# Patient Record
Sex: Female | Born: 1960
Health system: Southern US, Community
[De-identification: ages and names within clinical notes are randomized; demographics above are authoritative.]

## PROBLEM LIST (undated history)

## (undated) DIAGNOSIS — E785 Hyperlipidemia, unspecified: Secondary | ICD-10-CM

## (undated) DIAGNOSIS — Z9289 Personal history of other medical treatment: Secondary | ICD-10-CM

## (undated) DIAGNOSIS — IMO0001 Reserved for inherently not codable concepts without codable children: Secondary | ICD-10-CM

## (undated) DIAGNOSIS — M7989 Other specified soft tissue disorders: Secondary | ICD-10-CM

## (undated) DIAGNOSIS — K219 Gastro-esophageal reflux disease without esophagitis: Secondary | ICD-10-CM

## (undated) DIAGNOSIS — R002 Palpitations: Secondary | ICD-10-CM

## (undated) DIAGNOSIS — M2550 Pain in unspecified joint: Secondary | ICD-10-CM

## (undated) DIAGNOSIS — D649 Anemia, unspecified: Secondary | ICD-10-CM

## (undated) DIAGNOSIS — R112 Nausea with vomiting, unspecified: Secondary | ICD-10-CM

## (undated) DIAGNOSIS — F329 Major depressive disorder, single episode, unspecified: Secondary | ICD-10-CM

## (undated) DIAGNOSIS — E739 Lactose intolerance, unspecified: Secondary | ICD-10-CM

## (undated) DIAGNOSIS — R7303 Prediabetes: Secondary | ICD-10-CM

## (undated) DIAGNOSIS — K589 Irritable bowel syndrome without diarrhea: Secondary | ICD-10-CM

## (undated) DIAGNOSIS — Z9889 Other specified postprocedural states: Secondary | ICD-10-CM

## (undated) DIAGNOSIS — I10 Essential (primary) hypertension: Secondary | ICD-10-CM

## (undated) DIAGNOSIS — F32A Depression, unspecified: Secondary | ICD-10-CM

## (undated) DIAGNOSIS — R252 Cramp and spasm: Secondary | ICD-10-CM

## (undated) DIAGNOSIS — J45909 Unspecified asthma, uncomplicated: Secondary | ICD-10-CM

## (undated) DIAGNOSIS — F419 Anxiety disorder, unspecified: Secondary | ICD-10-CM

## (undated) DIAGNOSIS — N979 Female infertility, unspecified: Secondary | ICD-10-CM

## (undated) DIAGNOSIS — R0602 Shortness of breath: Secondary | ICD-10-CM

## (undated) HISTORY — DX: Irritable bowel syndrome, unspecified: K58.9

## (undated) HISTORY — DX: Hyperlipidemia, unspecified: E78.5

## (undated) HISTORY — DX: Palpitations: R00.2

## (undated) HISTORY — PX: APPENDECTOMY: SHX54

## (undated) HISTORY — DX: Anemia, unspecified: D64.9

## (undated) HISTORY — PX: ABDOMINAL HYSTERECTOMY: SHX81

## (undated) HISTORY — PX: OTHER SURGICAL HISTORY: SHX169

## (undated) HISTORY — DX: Shortness of breath: R06.02

## (undated) HISTORY — DX: Pain in unspecified joint: M25.50

## (undated) HISTORY — DX: Lactose intolerance, unspecified: E73.9

## (undated) HISTORY — DX: Cramp and spasm: R25.2

## (undated) HISTORY — DX: Unspecified asthma, uncomplicated: J45.909

## (undated) HISTORY — DX: Prediabetes: R73.03

## (undated) HISTORY — DX: Other specified soft tissue disorders: M79.89

## (undated) HISTORY — DX: Female infertility, unspecified: N97.9

---

## 1998-01-14 ENCOUNTER — Other Ambulatory Visit: Admission: RE | Admit: 1998-01-14 | Discharge: 1998-01-14 | Payer: Self-pay | Admitting: Obstetrics and Gynecology

## 1999-02-01 ENCOUNTER — Other Ambulatory Visit: Admission: RE | Admit: 1999-02-01 | Discharge: 1999-02-01 | Payer: Self-pay | Admitting: Obstetrics and Gynecology

## 1999-12-01 ENCOUNTER — Encounter: Payer: Self-pay | Admitting: *Deleted

## 1999-12-01 ENCOUNTER — Ambulatory Visit (HOSPITAL_COMMUNITY): Admission: RE | Admit: 1999-12-01 | Discharge: 1999-12-01 | Payer: Self-pay | Admitting: *Deleted

## 2000-03-20 ENCOUNTER — Other Ambulatory Visit: Admission: RE | Admit: 2000-03-20 | Discharge: 2000-03-20 | Payer: Self-pay | Admitting: Obstetrics and Gynecology

## 2000-07-30 ENCOUNTER — Encounter (INDEPENDENT_AMBULATORY_CARE_PROVIDER_SITE_OTHER): Payer: Self-pay | Admitting: Specialist

## 2000-07-30 ENCOUNTER — Inpatient Hospital Stay (HOSPITAL_COMMUNITY): Admission: RE | Admit: 2000-07-30 | Discharge: 2000-08-02 | Payer: Self-pay | Admitting: Obstetrics and Gynecology

## 2001-04-08 ENCOUNTER — Other Ambulatory Visit: Admission: RE | Admit: 2001-04-08 | Discharge: 2001-04-08 | Payer: Self-pay | Admitting: Obstetrics and Gynecology

## 2002-05-30 ENCOUNTER — Other Ambulatory Visit: Admission: RE | Admit: 2002-05-30 | Discharge: 2002-05-30 | Payer: Self-pay | Admitting: Obstetrics and Gynecology

## 2003-08-28 ENCOUNTER — Other Ambulatory Visit: Admission: RE | Admit: 2003-08-28 | Discharge: 2003-08-28 | Payer: Self-pay | Admitting: Obstetrics and Gynecology

## 2005-11-07 ENCOUNTER — Emergency Department (HOSPITAL_COMMUNITY): Admission: EM | Admit: 2005-11-07 | Discharge: 2005-11-08 | Payer: Self-pay | Admitting: Emergency Medicine

## 2005-12-16 ENCOUNTER — Emergency Department (HOSPITAL_COMMUNITY): Admission: EM | Admit: 2005-12-16 | Discharge: 2005-12-16 | Payer: Self-pay | Admitting: Family Medicine

## 2007-02-20 ENCOUNTER — Other Ambulatory Visit (HOSPITAL_COMMUNITY): Admission: RE | Admit: 2007-02-20 | Discharge: 2007-05-21 | Payer: Self-pay | Admitting: Psychiatry

## 2007-02-20 ENCOUNTER — Ambulatory Visit: Payer: Self-pay | Admitting: Psychiatry

## 2007-12-17 ENCOUNTER — Other Ambulatory Visit: Admission: RE | Admit: 2007-12-17 | Discharge: 2007-12-17 | Payer: Self-pay | Admitting: Family Medicine

## 2008-04-20 ENCOUNTER — Other Ambulatory Visit: Admission: RE | Admit: 2008-04-20 | Discharge: 2008-04-20 | Payer: Self-pay | Admitting: Family Medicine

## 2009-01-21 ENCOUNTER — Emergency Department (HOSPITAL_COMMUNITY): Admission: EM | Admit: 2009-01-21 | Discharge: 2009-01-21 | Payer: Self-pay | Admitting: Emergency Medicine

## 2009-04-05 ENCOUNTER — Ambulatory Visit (HOSPITAL_COMMUNITY): Admission: RE | Admit: 2009-04-05 | Discharge: 2009-04-05 | Payer: Self-pay | Admitting: Obstetrics and Gynecology

## 2010-07-11 LAB — BASIC METABOLIC PANEL
BUN: 8 mg/dL (ref 6–23)
CO2: 32 mEq/L (ref 19–32)
Calcium: 9.2 mg/dL (ref 8.4–10.5)
Chloride: 100 mEq/L (ref 96–112)
Creatinine, Ser: 0.76 mg/dL (ref 0.4–1.2)
GFR calc Af Amer: >60 mL/min (ref >60)
GFR calc non Af Amer: >60 mL/min (ref >60)
Glucose, Bld: 93 mg/dL (ref 70–99)
Potassium: 4.2 mEq/L (ref 3.5–5.1)
Sodium: 137 mEq/L (ref 135–145)

## 2010-07-11 LAB — CBC
HCT: 34.8 % — ABNORMAL LOW (ref 36.0–46.0)
Hemoglobin: 12 g/dL (ref 12.0–15.0)
MCHC: 34.5 g/dL (ref 30.0–36.0)
MCV: 95.2 fL (ref 78.0–100.0)
Platelets: 263 10*3/uL (ref 150–400)
RBC: 3.66 MIL/uL — ABNORMAL LOW (ref 3.87–5.11)
RDW: 14.1 % (ref 11.5–15.5)
WBC: 7.9 10*3/uL (ref 4.0–10.5)

## 2010-08-26 NOTE — Discharge Summary (Signed)
[facility]  Patient:    Andrea Garza, Andrea Garza                      MRN: [id number] Adm. Date:  [id number] Disc. Date: [id number] Attending:  Frederich Balding                           Discharge Summary  PREOPERATIVE DIAGNOSES:       1. Pelvic pain and abnormal bleeding, possibly                                  secondary to uterine adenomyosis.                               2. Anatomical stress urinary incontinence.  DISCHARGE DIAGNOSES:          1. Pelvic pain and abnormal bleeding, possibly                                  secondary to uterine adenomyosis.                               2. Anatomical stress urinary incontinence.  OPERATIVE PROCEDURE:          1. Total abdominal hysterectomy.                               2. Culdoplasty.                               3. Retropubic suspension of the bladder.                               4. Cystoscopy with placement of a suprapubic                                  catheter.  HISTORY AND PHYSICAL:         For a complete history and physical, please see dictated note.  HOSPITAL COURSE:              The patient underwent the above-noted surgery. Postoperatively she did well. Her postoperative hemoglobin was 9.9. We began clamping of the suprapubic catheter on the day after surgery. She was able to void. By the second postoperative day, most residuals were under 100 cc.  On the morning of her third postoperative day, the suprapubic catheter was removed. On her third postoperative day she was afebrile with stable vital signs, abdomen soft and nontender. Bowel sounds were active. She was passing flatus. As noted above, she was voiding without difficulty and  the suprapubic catheter was removed. A low transverse incision was intact and stable, staples were removed and Steri-Strips were applied. She had no active vaginal bleeding and was discharged home at that time.  In terms of complications, none were  encountered during stay in the hospital. The patient was discharged home in stable condition.  DISPOSITION:  Routine postoperative instructions already given. She is to avoid heavy lifting, vaginal entry, or driving a car.  DISCHARGE MEDICATIONS:        Tylox as needed for pain and iron sulfate supplementation.  DISCHARGE INSTRUCTIONS:       She will call with increasing abdominal pain. Increasing nausea and vomiting should be reported. Any active vaginal bleeding or fever also should be reported. We also discussed watching for evidence of any type of deep venous thrombosis.  DISCHARGE FOLLOWUP:           The patient will be called tomorrow, August 03, 2000, and arranged followup in one week. DD:  08/02/00 TD:  08/02/00 Job: [id number]

## 2010-08-26 NOTE — Cardiovascular Report (Signed)
[facility]  Patient:    Andrea Garza, Andrea Garza                      MRN: [id number] Proc. Date: 12/01/99 Adm. Date:  [id number] Attending:  Daisey Must CC:         Rosalyn Gess. Norins, M.D. [facility]  Dietrich Pates, M.D. [facility]  The Cardiac Catheterization Laboratory   Cardiac Catheterization  PROCEDURE PERFORMED:  Left heart catheterization with coronary angiography and left ventriculography.  INDICATIONS:  [name] is a 50 year old woman seen in the office by Dr. Tenny Craw with symptoms of recurrent and progressive chest pain.  She was referred for cardiac catheterization to rule-out coronary ______ .  PROCEDURAL NOTE:  A #6 French sheath was placed in the right femoral artery. Standard Judkins, #6 French catheters were utilized.  There appeared to be catheter induced spasm in both the ostium and the left and right coronaries, which was treated with intracoronary nitroglycerin.  Contrast was Omnipaque. There were no complications.  CATHETERIZATION RESULTS: 1. Hemodynamics: Left ventricular pressure is 142/20, and aortic pressure is    146/78.  There was no aortic valve gradient. 2. Left ventriculogram: Wall motion is normal.  Ejection fraction is estimated    at 60%.  No mitral regurgitation. 3. Coronary arteriography (right dominant):    a. Left main is absent.  The LAD and the left circumflex arise from the       same location but have separate ostia.    b. The left anterior descending artery gives rise to two, small diagonal       branches.  The LAD is free of angiography disease.    c. The left circumflex gives rise to a small OM-1, small OM-2, and a large       branching OM-3.  The left circumflex is free of angiographic disease.    d. The right coronary artery has a 30% stenosis at its origin, which I       suspect is catheter induced spasm.  This did improve with intracoronary       nitroglycerin.  There is a normal sized posterior descending  artery and       a normal sized posterolateral branch.  IMPRESSIONS: 1. Normal left ventricular systolic function. 2. Angiographically normal coronary arteries. DD:  12/01/99 TD:  12/01/99 Job: [id number]

## 2010-08-26 NOTE — H&P (Signed)
[facility]  Patient:    Andrea Garza, Andrea Garza                      MRN: [id number] Adm. Date:  [id number] Attending:  Frederich Balding                         History and Physical  REASON FOR ADMISSION:         The patient is a 50 year old gravida 2 para 2 married white female who presents for a total abdominal hysterectomy along with retropubic suspension of the bladder and placement of a suprapubic catheter.  HISTORY OF PRESENT ILLNESS:   The patient has had a long-standing history of increasing menstrual flow.  Cycles she described as heavy.  She has 7-10 days of flow with several days being heavy.  During these times she changes pads and tampons every one to two hours.  With this, she does have clots and increasing discomfort.  She has been on iron sulfate supplementation for maintenance of her hemoglobin.  Mild anemia has been noted with values of 11. Evaluation has included ultrasounds which have suggested possible uterine adenomyosis.  She had a previous laparoscopy in the past with findings of endometriosis.  We have tried her on birth control pills without any significant success.  In addition to the above heavy bleeding, the patient has also been troubled with increasing urinary leakage.  She leaks a small amount of urine with such things as coughing and sneezing.  This at times can be limiting in terms of her activities.  She underwent bladder studies in November 2001.  She had a positive Q-tip test with greater than 60 degrees deflection with straining. Her postvoid residual was less than 100 cc.  Urine cystometrics showed there was no evidence of uninhibited contractions.  She had normal bladder volumes. After the catheter was removed she did reveal leaking with coughing.  This was consistent with anatomical stress urinary incontinence.  In view of this she has decided to proceed with a retropubic suspension of the bladder.  In terms of a  success rate for this, we do quote her an 80-90% success rate long-term. She does understand the need for a placement of a suprapubic catheter for bladder retraining.  There is a potential for long-term catheterization. During rare times there may need to be the need for surgical release to eventually result in the removal of the catheter.  ALLERGIES:                    No known drug allergies.  MEDICATIONS:                  Zoloft and Prevacid.  PAST MEDICAL HISTORY:         Usual childhood diseases, no significant sequelae.  Last year, because of some chest pain, she underwent a cardiac workup including cardiac catheterization which was completely negative.  SURGICAL HISTORY:             She has had previous appendectomy as well as a 1988 diagnostic laparoscopy with laser standby.  OBSTETRICAL HISTORY:          She has had two spontaneous vaginal deliveries.  FAMILY HISTORY:               Strong family history of breast cancer - her mother and maternal aunt both have breast cancer.  Her father had colon  cancer.  SOCIAL HISTORY:               No tobacco or alcohol use.  REVIEW OF SYSTEMS:            Noncontributory.  PHYSICAL EXAMINATION:  VITAL SIGNS:                  Patient is afebrile with stable vital signs.  HEENT:                        Patient is normocephalic.  Pupils equal, round, and reactive to light and accommodation.  Extraocular movements were intact. Sclerae and conjunctivae are clear.  Oropharynx clear.  NECK:                         Without thyromegaly.  BREASTS:                      No discrete masses.  LUNGS:                        Clear.  CARDIOVASCULAR:               Regular rhythm and rate without murmurs or gallops.  ABDOMEN:                      Benign.  PELVIC:                       Normal external genitalia, vaginal mucosa clear. Cervix unremarkable.  Uterus upper limits of normal size, moderately tender and boggy.  Adnexa  unremarkable.  EXTREMITIES:                  Trace edema.  NEUROLOGIC:                   Grossly within normal limits.  BASIC IMPRESSION:             1. Menorrhagia and dysmenorrhea, possibly                                  secondary to uterine adenomyosis.                               2. Anatomical stress urinary incontinence.  PLAN:                         The patient to undergo total abdominal hysterectomy with retropubic suspension of the bladder and placement of a suprapubic catheter.  The risks of surgery have been discussed, including the risk of anesthesia; the risk of infection; the risk of hemorrhage that could necessitate transfusion with the risk of AIDS or hepatitis; the risk of injury to adjacent organs including bladder, bowel, or ureters that could require further exploratory surgery; the risk of deep venous thrombosis and pulmonary emboli.  The patient professed an understanding of the indications and risks. DD:  07/30/00 TD:  07/30/00 Job: [id number]

## 2010-08-26 NOTE — Op Note (Signed)
[facility]  Patient:    Andrea Garza, Andrea Garza                      MRN: [id number] Proc. Date: 07/30/00 Adm. Date:  [id number] Attending:  Frederich Balding                           Operative Report  PREOPERATIVE DIAGNOSES:       1. Probable uterine adenomyosis with associated                                  menorrhagia and dysmenorrhea.                               2. Anatomical stress urinary incontinence.  POSTOPERATIVE DIAGNOSES:      1. Probable uterine adenomyosis with associated                                  menorrhagia and dysmenorrhea.                               2. Anatomical stress urinary incontinence.  OPERATION:                    Total abdominal hysterectomy.  Culdoplasty.                               Retropubic suspension of the bladder using the                               Burch procedure.  Cystoscopy through a cystotomy                               incision.  Placement of suprapubic catheter.  SURGEON:                      Juluis Mire, M.D.  ASSISTANTWilley Blade, M.D.  ANESTHESIA:                   General endotracheal anesthesia.  ESTIMATED BLOOD LOSS:         300 to 400 cc.  PACKS AND DRAINS:             None.  INTRAOPERATIVE BLOOD PLACED:  None.  COMPLICATIONS:                None.  INDICATIONS:                  As dictated in the history and physical.  DESCRIPTION OF PROCEDURE:     The patient was taken to the OR and placed in the supine position. After satisfactory level of general endotracheal anesthesia was obtained, the patient was placed in the dorsal lithotomy position using Allen stirrups. The abdomen, peritoneum, and vagina were prepped out with Betadine and draped as a sterile field.  A  low transverse skin incision was made with a knife and carried through subcutaneous tissue. The anterior rectus fascia entered sharply and the incision in the fascia extended laterally.  The  fascia was taken off the muscle superiorly and inferiorly using both blunt and sharp dissection. Rectus muscles were separated in the midline. The anterior peritoneum was entered sharply. The incision in the peritoneum extended both superiorly and inferiorly.  Palpation of the upper abdomen revealed a smooth liver, gallbladder not palpable, both kidneys were felt to be of normal size and size, and appendix was surgically absent.  Uterus was upper limits of normal size. The tubes and ovaries were unremarkable.  An OConnor-OSullivan retractor was put in place and bowel contents were packed superiorly.  Both round ligaments were clamped, cut, and suture ligated with 0 Vicryl. Both utero-ovarian pedicles were isolated, clamped, cut and doubly ligated first with a free tie of 0 Vicryl and then with a suture ligature of 0 Vicryl.  The bladder flap was then developed. Using the clamp, cut and tie technique with suture ligatures of 0 Vicryl, the parametrium was serially separated from the side of the uterus down to the vaginal angles.  The vaginal angles were then clamped and cut. These were suture ligated with 0 Vicryl.  Intervening vaginal mucosa was then excised and the uterus was passed off the operative field.  The vaginal cuff was then closed with running suture of 0 Vicryl.  Areas of oozing from the bladder base were brought under control with the bipolar.  The ovaries were suspended to the round ligament with 0 Vicryl.  We brought the uterosacral ligaments together in the midline with interrupted sutures of 0 Vicryl avoiding the ureters.  With this, we had complete obliteration of the cul-de-sac.  We thoroughly irrigated the pelvis and hemostasis was excellent.  There was no active bleeding.  Urine output remained clear and adequate.  At this point in time, the sponge and self-retaining retractor were removed.  The peritoneum was closed with running suture of 2-0 Vicryl.  Attention was now  turned to the retropubic suspension of the bladder. The space of Retzius was developed.  The gloved hand was then placed in the vaginal vault.  Two sutures of 0 Prolene were placed at the urethrovesical, angled in the underlying paravaginal tissue and secured to Coopers ligament. Two lateral sutures were also placed in the paravaginal tissue and also secured to Coopers ligament. These were all tied down with excellent elevation of the bladder.  No active bleeding was encountered.  The bladder was then distended using irrigation through a three-way Foley.  A cystotomy was made with an 11 blade and the cystoscope was then introduced. Visualization revealed no evidence of suture material in the bladder. The patient had been given indigo carmine IV and both ureteral orifices were noted to be spilling copious amounts of blue urine. The cystoscope was then removed.  A Bonnano catheter was placed through the skin and through the cystostomy site and then this was closed with 3-0 chromic.  Several layers were brought together to close the cystotomy site over the Bonnano catheter using the 3-0 chromic.  We had good hemostasis at this point in time.  We thoroughly irrigated the uterine incision.  The muscles were brought together with 3-0 Vicryl.  The fascia was closed with running suture of 0 Panacryl, sub q was closed with 3-0 Vicryl and the skin was closed with staples and Steri-Strips. Sponge, needle and instrument counts were reported  as correct by the circulating nurse x 2.  Urine output was blue-tinged but clear at this point in time.  The patient was extubated and alert and transferred to the recovery room in good condition. DD:  07/30/00 TD:  07/31/00 Job: [id number]

## 2010-12-10 DIAGNOSIS — Z9289 Personal history of other medical treatment: Secondary | ICD-10-CM

## 2010-12-10 HISTORY — DX: Personal history of other medical treatment: Z92.89

## 2010-12-10 HISTORY — PX: OTHER SURGICAL HISTORY: SHX169

## 2011-01-07 ENCOUNTER — Emergency Department (HOSPITAL_COMMUNITY): Payer: BC Managed Care – PPO

## 2011-01-07 ENCOUNTER — Inpatient Hospital Stay (HOSPITAL_COMMUNITY)
Admission: EM | Admit: 2011-01-07 | Discharge: 2011-01-13 | DRG: 218 | Disposition: A | Discharge status: Home / Self Care | Payer: BC Managed Care – PPO | Attending: Orthopedic Surgery | Admitting: Orthopedic Surgery

## 2011-01-07 DIAGNOSIS — F329 Major depressive disorder, single episode, unspecified: Secondary | ICD-10-CM | POA: Diagnosis present

## 2011-01-07 DIAGNOSIS — Y998 Other external cause status: Secondary | ICD-10-CM

## 2011-01-07 DIAGNOSIS — S42309A Unspecified fracture of shaft of humerus, unspecified arm, initial encounter for closed fracture: Secondary | ICD-10-CM | POA: Diagnosis present

## 2011-01-07 DIAGNOSIS — W010XXA Fall on same level from slipping, tripping and stumbling without subsequent striking against object, initial encounter: Secondary | ICD-10-CM | POA: Diagnosis present

## 2011-01-07 DIAGNOSIS — R7309 Other abnormal glucose: Secondary | ICD-10-CM | POA: Diagnosis present

## 2011-01-07 DIAGNOSIS — I10 Essential (primary) hypertension: Secondary | ICD-10-CM | POA: Diagnosis present

## 2011-01-07 DIAGNOSIS — Z6841 Body Mass Index (BMI) 40.0 and over, adult: Secondary | ICD-10-CM

## 2011-01-07 DIAGNOSIS — F3289 Other specified depressive episodes: Secondary | ICD-10-CM | POA: Diagnosis present

## 2011-01-07 DIAGNOSIS — Y9229 Other specified public building as the place of occurrence of the external cause: Secondary | ICD-10-CM

## 2011-01-07 DIAGNOSIS — S42253A Displaced fracture of greater tuberosity of unspecified humerus, initial encounter for closed fracture: Principal | ICD-10-CM | POA: Diagnosis present

## 2011-01-07 DIAGNOSIS — D62 Acute posthemorrhagic anemia: Secondary | ICD-10-CM | POA: Diagnosis not present

## 2011-01-07 DIAGNOSIS — E441 Mild protein-calorie malnutrition: Secondary | ICD-10-CM | POA: Diagnosis not present

## 2011-01-07 LAB — DIFFERENTIAL
Basophils Absolute: 0 10*3/uL (ref 0.0–0.1)
Basophils Relative: 0 % (ref 0–1)
Eosinophils Absolute: 0 10*3/uL (ref 0.0–0.7)
Eosinophils Relative: 0 % (ref 0–5)
Monocytes Absolute: 0.6 10*3/uL (ref 0.1–1.0)
Monocytes Relative: 4 % (ref 3–12)
Neutro Abs: 14.4 10*3/uL — ABNORMAL HIGH (ref 1.7–7.7)

## 2011-01-07 LAB — COMPREHENSIVE METABOLIC PANEL
ALT: 16 U/L (ref 0–35)
Albumin: 3.7 g/dL (ref 3.5–5.2)
Alkaline Phosphatase: 85 U/L (ref 39–117)
Calcium: 9.2 mg/dL (ref 8.4–10.5)
GFR calc Af Amer: >60 mL/min (ref >60)
Potassium: 3.9 mEq/L (ref 3.5–5.1)
Sodium: 138 mEq/L (ref 135–145)
Total Protein: 7.2 g/dL (ref 6.0–8.3)

## 2011-01-07 LAB — CBC
MCH: 31.9 pg (ref 26.0–34.0)
MCHC: 34.1 g/dL (ref 30.0–36.0)
Platelets: 232 10*3/uL (ref 150–400)
RDW: 13.4 % (ref 11.5–15.5)

## 2011-01-07 LAB — ABO/RH: ABO/RH(D): A POS

## 2011-01-08 LAB — HEMOGLOBIN AND HEMATOCRIT, BLOOD: Hemoglobin: 12.5 g/dL (ref 12.0–15.0)

## 2011-01-09 ENCOUNTER — Inpatient Hospital Stay (HOSPITAL_COMMUNITY): Payer: BC Managed Care – PPO

## 2011-01-09 LAB — MAGNESIUM: Magnesium: 1.9 mg/dL (ref 1.5–2.5)

## 2011-01-09 LAB — PHOSPHORUS: Phosphorus: 2.5 mg/dL (ref 2.3–4.6)

## 2011-01-09 LAB — CALCIUM: Calcium: 9 mg/dL (ref 8.4–10.5)

## 2011-01-10 ENCOUNTER — Inpatient Hospital Stay (HOSPITAL_COMMUNITY): Payer: BC Managed Care – PPO

## 2011-01-10 LAB — COMPREHENSIVE METABOLIC PANEL
AST: 12 U/L (ref 0–37)
BUN: 9 mg/dL (ref 6–23)
CO2: 30 mEq/L (ref 19–32)
Calcium: 8.9 mg/dL (ref 8.4–10.5)
Chloride: 101 mEq/L (ref 96–112)
Creatinine, Ser: 0.62 mg/dL (ref 0.50–1.10)
GFR calc Af Amer: >90 mL/min (ref >90)
GFR calc non Af Amer: >90 mL/min (ref >90)
Glucose, Bld: 101 mg/dL — ABNORMAL HIGH (ref 70–99)
Total Bilirubin: 0.4 mg/dL (ref 0.3–1.2)

## 2011-01-10 LAB — CBC
HCT: 30.1 % — ABNORMAL LOW (ref 36.0–46.0)
HCT: 29.1 % — ABNORMAL LOW (ref 36.0–46.0)
MCH: 31.7 pg (ref 26.0–34.0)
MCHC: 35.4 g/dL (ref 30.0–36.0)
MCV: 93.5 fL (ref 78.0–100.0)
Platelets: 150 10*3/uL (ref 150–400)
RBC: 3.22 MIL/uL — ABNORMAL LOW (ref 3.87–5.11)
RDW: 14.3 % (ref 11.5–15.5)
WBC: 7 10*3/uL (ref 4.0–10.5)
WBC: 7.7 10*3/uL (ref 4.0–10.5)

## 2011-01-10 LAB — PROTIME-INR
INR: 1.25 (ref 0.00–1.49)
Prothrombin Time: 16 seconds — ABNORMAL HIGH (ref 11.6–15.2)

## 2011-01-10 LAB — CALCIUM, IONIZED: Calcium, Ion: 1.2 mmol/L (ref 1.12–1.32)

## 2011-01-10 LAB — PTH, INTACT AND CALCIUM
Calcium, Total (PTH): 8.8 mg/dL (ref 8.4–10.5)
PTH: 69.4 pg/mL (ref 14.0–72.0)

## 2011-01-11 LAB — TYPE AND SCREEN
Unit division: 0
Unit division: 0

## 2011-01-11 LAB — POCT I-STAT 4, (NA,K, GLUC, HGB,HCT)
Glucose, Bld: 118 mg/dL — ABNORMAL HIGH (ref 70–99)
HCT: 21 % — ABNORMAL LOW (ref 36.0–46.0)
Hemoglobin: 7.1 g/dL — ABNORMAL LOW (ref 12.0–15.0)
Potassium: 3.8 mEq/L (ref 3.5–5.1)
Sodium: 138 mEq/L (ref 135–145)

## 2011-01-11 LAB — PROTIME-INR
INR: 1.12 (ref 0.00–1.49)
Prothrombin Time: 14.6 seconds (ref 11.6–15.2)

## 2011-01-11 LAB — BASIC METABOLIC PANEL
BUN: 7 mg/dL (ref 6–23)
Creatinine, Ser: 0.57 mg/dL (ref 0.50–1.10)
GFR calc Af Amer: >90 mL/min (ref >90)
GFR calc non Af Amer: >90 mL/min (ref >90)
Glucose, Bld: 103 mg/dL — ABNORMAL HIGH (ref 70–99)
Potassium: 3.5 mEq/L (ref 3.5–5.1)

## 2011-01-11 LAB — CBC
Hemoglobin: 9.5 g/dL — ABNORMAL LOW (ref 12.0–15.0)
MCH: 31.3 pg (ref 26.0–34.0)
MCHC: 34.2 g/dL (ref 30.0–36.0)
Platelets: 152 10*3/uL (ref 150–400)
RDW: 14.9 % (ref 11.5–15.5)

## 2011-01-11 LAB — VITAMIN D 1,25 DIHYDROXY: Vitamin D2 1, 25 (OH)2: <8 pg/mL

## 2011-01-12 LAB — BASIC METABOLIC PANEL
BUN: 6 mg/dL (ref 6–23)
Chloride: 102 mEq/L (ref 96–112)
Creatinine, Ser: 0.51 mg/dL (ref 0.50–1.10)
Glucose, Bld: 103 mg/dL — ABNORMAL HIGH (ref 70–99)
Potassium: 3.4 mEq/L — ABNORMAL LOW (ref 3.5–5.1)

## 2011-01-12 LAB — CBC
HCT: 26 % — ABNORMAL LOW (ref 36.0–46.0)
Hemoglobin: 8.8 g/dL — ABNORMAL LOW (ref 12.0–15.0)
MCV: 93.2 fL (ref 78.0–100.0)
WBC: 9.4 10*3/uL (ref 4.0–10.5)

## 2011-01-17 NOTE — Consult Note (Signed)
[name], SUITT NO.:  [id number]  MEDICAL RECORD NO.:  [id number]  LOCATION:  5012                         FACILITY:  [facility]  PHYSICIAN:  Doralee Albino. Carola Frost, M.D. DATE OF BIRTH:  12/25/1960  DATE OF CONSULTATION:  01/09/2011 DATE OF DISCHARGE:                                CONSULTATION   REQUESTING PHYSICIAN:  Dr. Vira Browns, Orthopedics  REASON FOR CONSULTATION:  Fall with proximal right humerus fracture.  BRIEF HISTORY OF PRESENT ILLNESS:  Miss Andrea Garza is a very pleasant 50- year-old right-hand-dominant female who was attending a wedding on Saturday afternoon.  She was been escorted down the [place].  She went to step into the row that her seat was in.  She did not appreciate the height difference from the floor to the surface that she was stepping on.  She subsequently lost her balance and struck a solid oak pew with her right shoulder.  Immediately, the patient knew there was something wrong as she was unable to move her right upper extremity.  She was brought to [facility] for evaluation.  X-rays here demonstrated a proximal right humerus fracture with shaft extension.  The patient was initially seen and evaluated by Dr. Vira Browns but given the severity and complexity of the injury, he sought consultation from Dr. Carola Frost in the Orthopedic Trauma Service.  The patient was admitted for pain control.  Currently, the patient is room 5 out of 12.  She states that her pain is 6-7/10, but is controlled with IV pain medication.  She denies any numbness or tingling in her right upper extremity.  Denies injuries elsewhere.  Denies any pain in her right elbow or wrist.  No chest pain or shortness of breath.  No nausea, vomiting or diarrhea. The patient denies any syncopal or episodes of blacking out prior to her fall.  She denies any recent illnesses.  No other complaints or concerns are expressed at this current time.  PAST MEDICAL HISTORY:  Notable  for hypertension and depression.  FAMILY HISTORY:  Noncontributory.  The patient denies any history of osteoporosis or metabolic bone disease.  SURGICAL HISTORY:  Notable for: 1. 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 2. TAH. 3. Appendectomy. 4. The patient did have a cardiac cath back in 2001, which     demonstrated normal coronary arteries and normal left ventricular     systolic function.  MEDICATIONS PRIOR TO ADMISSION:  Losartan and Effexor.  SOCIAL HISTORY:  The patient again is right-hand-dominant.  She is married, lives with her husband.  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  She does not smoke and is a social drinker. Does not use any additional drugs.  PHYSICAL EXAMINATION:  VITAL SIGNS:  Temperature 97.8, heart rate 84, respirations 16 and 97% on room air, BP is 116/72.  Most recent H and H is 12.5 and 37.7.  No significant abnormalities on admission labs were noted.  The patient's EKG on admission demonstrates sinus rhythm as well.  GENERAL:  The patient is awake, alert, in no acute distress, comfortable.  Appears appropriate for stated age, is obese. HEENT:  Head is atraumatic.  Extraocular muscles are intact.  Moist mucous membranes are noted. NECK:  Supple.  No lymphadenopathy.  No spinous process tenderness.  The patient demonstrates full range of motion. LUNGS:  Clear to auscultation bilaterally. CARDIAC:  S1-S2 are noted and regular. ABDOMEN:  Soft, nontender.  Positive bowel sounds.  Obese. EXTREMITIES:  Bilateral lower extremities are without any acute findings.  Motor and sensory functions are intact.  No deep calf tenderness is noted.  Left upper extremity is unremarkable as well. Radial, ulnar, median, axillary nerve, motor and sensory function are intact.  Extremities are warm.  Palpable radial pulse is appreciated. The patient demonstrates full range of motion of her shoulder, elbow, forearm, wrist and hand.  No  tenderness to palpation along her clavicle, proximal humerus, humeral shaft, elbow, forearm, wrist or hand. Examination of her right upper extremity the patient in long-arm posterior splint.  She is in a sling.  I did move the proximal aspect of the splint a little bit to assess her soft tissue, was not really able to fully access her shoulder, so I did not feel too substantial skin wrinkle with gentle compression.  The patient does have some tenderness to palpation.  Axillary nerve sensory function is intact.  Distally, fingers are warm.  Palpable radial pulse appreciated.  Swelling is very well controlled.  No pain with passive stretch.  Radial, ulnar, median nerve sensory functions are intact.  AIN and PIN motor function is intact as well.  No findings noted at the wrist or hand.  The patient does not report any tenderness at her elbow.  X-rays T view of her right shoulder demonstrates a comminuted right proximal humerus fracture.  It does appear to be involved in the portion of the greater tuberosity and I am concerned that there is head splitting portion.  There is extension of the propagation of the fracture in a spiral pattern down to the shaft.  Displacement is fairly mild with apex anterior type deformity.  Chest x-ray no acute findings are noted.  ASSESSMENT AND PLAN:  A 50 year old right-hand-dominant female status post mechanical fall with right proximal humerus and right humeral shaft fracture. 1. Right proximal humerus and right humeral shaft fracture OTA     classification 11 - C1 and 12-A1.  Firstly, we will obtain a CT     scan to fully evaluate the character of this fracture.  We plan to     take the patient to surgery tomorrow for formal ORIF.  Given her     age and her hand dominance, primary fixation of her shoulder is     warranted.  I do not feel that she is a candidate for     hemiarthroplasty at this time.  I did discuss all risks and     benefits of  proceeding with surgery with the patient.  She would     like to proceed a.s.a.p. but I am concerned that there is a head     splitting component to the fracture.  Again, a CT scan will help to     evaluate this.  The patient will be pendulums and passive motion     for the next 2-4 weeks with commencement of active assistive range     of motion of her shoulder at 4-6 week mark and active motion likely     occurring at the 8-week mark with resistance thereafter.  The     patient will have unrestricted active motion of her elbow, forearm,  wrist and hand after surgery.  She will be in a sling for comfort     as well.  I would expect a 2-3-day hospitalization after surgery.     The patient will be allowed to discharge home once her pain is     under control and if she is mobilizing well. 2. Hypertension.  Continue home medications. 3. Depression.  Continue home medications. 4. Pain.  We will continue PCA for now.  We will add intravenous     Tylenol to decrease narcotic use.  I have written the patient for     breakthrough OxyIR as well as Robaxin for additional control.  Deep     vein thrombosis pulmonary embolism prophylaxis and mobilization and     foot pumps.  The patient does not need any pharmacologic deep vein     thrombosis prophylaxis.  Diet:  Continue regular diet for now,     n.p.o. after midnight. 5. Metabolic bone disease.  Given fairly low energy injury with a fall     from a standing height, I will evaluate for any secondary causes of     osteoporosis.  We will check some basic labs and proceed from there     as labs indicate.  The patient does not have any significant risk     factors other than obesity for issues with nutrition and vitamin     and mineral metabolism.  No evidence of diabetes, chronic diseases     such as chronic obstructive pulmonary disease or chronic steroid     medication use which would indicate issues with bone healing and     bone metabolism.  No  evidence of hyperthyroidism as well.  But,     again we will check some basic labs to evaluate for any secondary     causes of metabolic bone disease.  DISPOSITION:  Proceed to the OR tomorrow for formal plate osteosynthesis of her right proximal humerus fracture and obtain CT scan today prior to surgery to facilitate surgical planning.     Mearl Latin, PA   ______________________________ Doralee Albino. Carola Frost, M.D.    KWP/MEDQ  D:  01/09/2011  T:  01/09/2011  Job:  [id number]  Electronically Signed by Montez Morita PA on 01/11/2011 12:07:43 PM Electronically Signed by Myrene Galas M.D. on 01/17/2011 06:53:52 PM

## 2011-01-17 NOTE — Discharge Summary (Signed)
Andrea Garza, Andrea Garza NO.:  [id number]  MEDICAL RECORD NO.:  [id number]  LOCATION:  5014                         FACILITY:  [facility]  PHYSICIAN:  Doralee Albino. Carola Frost, M.D. DATE OF BIRTH:  1960-09-10  DATE OF ADMISSION:  01/07/2011 DATE OF DISCHARGE:  01/13/2011                              DISCHARGE SUMMARY   DISCHARGE DIAGNOSES: 1. Mechanical fall. 2. Right proximal humerus fracture and right humeral shaft fracture. 3. Acute blood loss anemia, stable and asymptomatic. 4. Hypertension, stable on home meds. 5. Depression, stable on home meds. 6. Morbid obesity and mild malnutrition.  PROCEDURES PERFORMED:  On January 10, 2011, ORIF, right proximal humerus including greater tuberosity and ORIF, right humeral shaft.  BRIEF HISTORY OF PRESENT ILLNESS/BRIEF HOSPITAL COURSE:  Andrea Garza is very pleasant 50 year old right-hand dominant Caucasian female who was involved in a fall on January 07, 2011 while at church.  The patient misjudged the step and lost her balance and struck her right arm against the solid oak pew.  The patient was brought to [facility] for evaluation where she was found to have a complex right proximal humerus and right humeral shaft fracture.  She was seen and evaluated by Dr. Otelia Sergeant and admitted to the Orthopedic Service.  Given the complexity of the injury, Orthopedic Trauma Service and Dr. Carola Frost was consulted for definitive management.  The patient was seen by the Orthopedic Trauma Service on January 09, 2011 for preoperative evaluation.  The patient was then taken to the OR the following day on January 10, 2011 for procedure described up above.  The patient's hospital stay was relatively uncomplicated.  She progressed very well with physical therapy and occupational therapy on postoperative day #1.  She did not really ever demonstrate any acute abnormalities in her labs or vital signs.  Pain was well under control as well.  I did  initiate workup for possible metabolic bone disease, however, the patient did not exhibit any laboratory findings suggestive of such.  Particularly her PTH was within the normal range at 69.4, ionized calcium was 1.20.  Specific alkaline phosphatase was also within normal range of 11.6.  Vitamin D panel demonstrated a 25 hydroxy vitamin D level of 43.  Postoperative day #1, the patient was doing well, did complain of some right shoulder pain which was feeling like a toothache.  Did not have any numbness or tingling.  No chest pain, no shortness breath.  No nausea, vomiting, or diarrhea.  The patient was afebrile, vital signs stable.  Her labs were stable as well.  Physical exam was unremarkable.  Motor and sensory functions were intact with respect to the right upper extremity.  The patient began PT, OT on postoperative day #1 as well and she did very well from this standpoint.  Postoperative day #2, the patient continued to do well without any acute issues.  Feeling much better.  Pain was improved.  We did stop her PCA on postoperative day #2 and her pain was well-controlled with oral pain medications.  Again physical exam, her vital signs were unremarkable.  The patient was mobilizing by herself without any significant issues.  Postoperative day #3, the patient was  deemed stable for discharge to home.  Clinical encounter note of postoperative day #3 is as follows.  Subjective/objective, the patient is doing much better.  No pain medicines midnight and feeling very well. She is voiding well, has not had bowel movements in several days.  PHYSICAL EXAMINATION:  VITAL SIGNS:  Temperature 97.4, heart rate 88, respirations 18, and 99% on room air, BP is 118/58. GENERAL:  The patient sitting on edge of bed, eating breakfast. LUNGS:  Clear bilaterally. CARDIAC:  S1 and S2 are noted. ABDOMEN:  Soft, nontender with positive bowel sounds. EXTREMITIES:  Right upper extremity, incision looks  fantastic.  Radial, ulnar, median, and axillary nerve sensory function are intact.  Radial, ulnar, median, anterior interosseous and posterior interosseous nerve motor function intact.  Palpable radial pulse appreciated.  Extremities warm.  Swelling is well-controlled.  LABORATORY DATA:  Recent labs from January 12, 2011, sodium 136, potassium 3.4, chloride 102, bicarb 30, BUN 6, creatinine 0.51, glucose 130, calcium 8.1, hemoglobin 8.8, hematocrit 26.0, platelets 178,000, white blood cell 9.4, 25 hydroxy vitamin D is 43, intact PTH 69.4.  TSH normal at 1.309, prealbumin is slightly decreased at 15.3, phosphorus 2.5, magnesium 1.9.  ASSESSMENT/PLAN:  A 50 year old right-hand-dominant female status post mechanical fall with right proximal humerus fracture and humeral shaft fracture. 1. Right proximal humerus fracture and humeral shaft fracture status     post open reduction internal fixation postoperative day #3, doing     great.  Discharge home today. Nonweightbearing right upper     extremity.  Right shoulder pendulums and very gentle passive range     of motion of the right shoulder, abduction and forward flexion to  about 20 degrees, internal external rotation no greater than 10     degrees.  Active range of motion of the right elbow, forearm,     wrist, and hand are encouraged as well.  Ice as needed and dressing     changes as needed.  Sling for comfort. 2. Acute blood loss anemia, stable and asymptomatic. 3. Hypertension, stable home meds. 4. Depression, home meds. 5. Acute pain on oral pain medications are well controlled. 6. Deep venous thrombosis, pulmonary embolism prophylaxis.  The     patient was covered with Lovenox during her inpatient stay.  She     will not require any pharmacologics at discharge. 7. Morbid obesity and mild malnutrition based on low prealbumin.     Continue with well-balanced diet. 8. Disposition.  Discharge home day.  Follow up with Orthopedics in  10-     14 days.  DISCHARGE MEDICATIONS: 1. Vitamin C 500 mg 1 p.o. daily. 2. Colace 100 mg 1 p.o. b.i.d. as needed for her bowel movement. 3. Robaxin 500 mg 1-2 p.o. q.6 hours as needed for spasms. 4. OxyIR 1-3 p.o. q.3 hours as needed for pain. 5. Percocet 5/325, 1-2 p.o. q.6 hours as needed for pain. 6. The patient will resume her home medications including     losartan/hydrochlorothiazide 50/12.5, 1 p.o. daily, trazodone 100     mg 1 p.o. daily at bedtime, and venlafaxine 100 mg 1 p.o. b.i.d.  DISCHARGE INSTRUCTIONS AND PLANS:  Andrea Garza did sustain a fairly severe injury to her right proximal humerus which is her dominant side. In spite of this, we were able to achieve excellent fixation with plate osteosynthesis.  We are hopeful that the patient will go on and unite uneventfully and regain full motion and function of her right upper extremity.  The patient  will be nonweightbearing in her right upper extremity for the next 6-8 weeks.  She will be restricted to right shoulder pendulums and very gentle passive motion of her right shoulder for the next 4-6 weeks.  Gentle passive motion should be abduction and forward flexion to approximately 20 degrees and internal and external rotation no greater than 10 degrees.  Again in this particular time frame for the next 2 weeks, primary focus is pendulums.  The patient will also perform active range of motion of her right elbow, forearm, wrist, and hand.  I have discussed at length with the patient the importance of coming out of a sling periodically to work on elbow motion on her own as well as wrist, forearm, and hand range of motion.  I did not want the patient develop a contracture at her elbow.  Fortunately, Andrea Garza does not smoke nor does she demonstrate any evidence of metabolic bone disease.  Therefore, I would anticipate fairly uncomplicated healing course.  The patient will resume her regular diet prehospitalization.  She  does not require any pharmacologic DVT prophylaxis as she had been covered while she has been inpatient.  She will have home health OT primarily to ensure that she is working on shoulder motion as well as elbow, forearm, wrist, and hand motion.  The patient can use ice as needed for swelling and pain control.  She can use an Ace wrap or a upper extremity compression sleeve to help with swelling control.  I have encouraged the patient to continue motion of her digits to help with reduction of swelling as well.  The patient will be on Percocet, OxyIR, and Robaxin for pain control as well.  She will contact us if she has any issues with these pain medications.  I would like see the patient back in 10-14 days for reevaluation, followup x- rays, and possible removal of staples at that time.  Should the patient have any questions prior to her followup, she is encouraged to contact the office at [phone].     Andrea Latin, PA   ______________________________ Doralee Albino. Carola Frost, M.D.    KWP/MEDQ  D:  01/13/2011  T:  01/13/2011  Job:  [id number]  Electronically Signed by Montez Morita PA on 01/17/2011 04:30:04 PM Electronically Signed by Myrene Galas M.D. on 01/17/2011 06:54:10 PM

## 2011-01-17 NOTE — Op Note (Signed)
NAMEJACQULENE, Andrea Garza NO.:  [id number]  MEDICAL RECORD NO.:  [id number]  LOCATION:  3313                         FACILITY:  [facility]  PHYSICIAN:  Doralee Albino. Carola Frost, M.D. DATE OF BIRTH:  Aug 11, 1960  DATE OF PROCEDURE:  01/10/2011 DATE OF DISCHARGE:                              OPERATIVE REPORT   PREOPERATIVE DIAGNOSES: 1. Right proximal humerus fracture involving the greater tuberosity. 2. Right humeral shaft fracture.  POSTOPERATIVE DIAGNOSES: 1. Right proximal humerus fracture involving the greater tuberosity. 2. Right humeral shaft fracture.  PROCEDURE: 1. Open reduction and internal fixation, right proximal humerus. 2. Open reduction and internal fixation of humeral shaft.  SURGEON:  Doralee Albino. Carola Frost, MD  ASSISTANT:  Mearl Latin, PA  ANESTHESIA:  General.  COMPLICATIONS:  None.  TOURNIQUET:  None.  ESTIMATED BLOOD LOSS:  600 mL.  FINDINGS:  Continued intramedullary bleeding without a vascular injury.  DISPOSITION:  To PACU.  CONDITION:  Stable.  BRIEF SUMMARY AND INDICATION FOR PROCEDURE:  Andrea Garza is a 50 year old right-hand dominant female who fell at church during a wedding.  She underwent preoperative imaging consisting of plain films and CT scan demonstrating extensive fracture of both the proximal femur involving the greater tuberosity as well as a humeral shaft fracture distally.  We discussed with her the risks and benefits of surgery including possibility of infection, nerve injury, vessel injury, need for further surgery, malunion, nonunion, DVT, PE, heart attack, stroke, and multiple others.  She did wish to proceed.  BRIEF DESCRIPTION OF PROCEDURE:  Ms. Koury was taken to the operating room after administration of preop antibiotics.  She was positioned supine on a radiolucent table.  Standard prep and drape was performed of her right shoulder.  A standard deltopectoral approach was then made. Dissection was carried  down to the deltopectoral interval.  The cephalic vein was identified and dissected out and protected.  We then encountered the fracture site proximally and the patient began to bleed from this.  The bleeding persisted throughout the case and did not stop even after initial provisional reduction.  We did not encounter any significant bleeding from other sources and we did use thrombin spray in an attempt to improve some of the periosteal bleeding which we felt could contribute.  The fracture fragments were then exposed.  A curette was used to remove hematoma and the proximal humerus was exposed such that two #2 FiberWires could be passed into the greater tuberosity fragment and rotator cuff using Mason-Allen technique.  These did allow for retraction and control of that fragment.  Then attention was turned to the distal shaft fracture.  With the help of my assistant, Montez Morita, and the surgical scrub nurse, we were able to pull the fracture out to length and rotated, but could not be held provisionally with tenaculum or other limited fixation. Consequently, the small two 2-0 plates and 1.5 screws from the modular foot set were used to achieve a provisional reduction and to help assist with control the fragments and dialing in of the reduction through multiple adjustments.  Plates had to be fixed both to the medial side of the shaft fracture as well as anteriorly  along the comminuted lateral proximal fragment that did extend all the way up to the head.  After placement of 3 screws in each of those 2 plates, we then fastened a long DePuy Biomet plate that was positioned appropriately with K-wires into the head and on the shaft and was used to span both fractured areas. This was sequentially tightened down to bone and then fixation placed into the head.  The sutures controlling the greater tuberosity fragment were tied over the end of the proximal holes of the plate.  This resulted in what  appeared to be near anatomic reconstruction.  There were several additional 2.4 lag screws placed outside of the plate as well.  Montez Morita, PA-C, assisted me throughout the procedure and was absolutely necessary for its completion.  Additional plates and difficulty of the fracture extended the operative time but more or less double it.  The patient did receive 2 units of packed cells during the procedure for the persistent bleeding which did ultimately cease following application of the final plate and after administration of the 2 units.  Coags did not show significant elevation of the INR, PTT though the PT was slightly elevated at 16.  The patient was then taken to PACU in stable condition after standard layered closure including staples for the skin.  PROGNOSIS:  Ms. Encina will be in a sling for comfort with passive range of motion for the next 6 weeks and then active, assisted, and graduating to active motion.  She will mobilize as tolerated.  Her borderline diabetes increase the risk of nonunion and infection.     Doralee Albino. Carola Frost, M.D.     MHH/MEDQ  D:  01/10/2011  T:  01/10/2011  Job:  [id number]  Electronically Signed by Myrene Galas M.D. on 01/17/2011 06:53:57 PM

## 2013-01-29 ENCOUNTER — Encounter: Payer: Self-pay | Admitting: Cardiology

## 2013-02-06 DIAGNOSIS — R55 Syncope and collapse: Secondary | ICD-10-CM

## 2013-10-08 ENCOUNTER — Other Ambulatory Visit: Payer: Self-pay | Admitting: Family Medicine

## 2013-10-08 DIAGNOSIS — R2689 Other abnormalities of gait and mobility: Secondary | ICD-10-CM

## 2013-10-08 DIAGNOSIS — R42 Dizziness and giddiness: Secondary | ICD-10-CM

## 2013-10-25 ENCOUNTER — Other Ambulatory Visit: Payer: 59

## 2013-10-25 ENCOUNTER — Ambulatory Visit
Admission: RE | Admit: 2013-10-25 | Discharge: 2013-10-25 | Disposition: A | Discharge status: Home / Self Care | Payer: 59 | Source: Ambulatory Visit | Attending: Family Medicine | Admitting: Family Medicine

## 2013-10-25 DIAGNOSIS — R42 Dizziness and giddiness: Secondary | ICD-10-CM

## 2013-10-25 DIAGNOSIS — R2689 Other abnormalities of gait and mobility: Secondary | ICD-10-CM

## 2015-06-16 ENCOUNTER — Other Ambulatory Visit: Payer: Self-pay | Admitting: Gastroenterology

## 2015-06-17 ENCOUNTER — Encounter (HOSPITAL_COMMUNITY): Payer: Self-pay | Admitting: *Deleted

## 2015-06-21 ENCOUNTER — Ambulatory Visit (HOSPITAL_COMMUNITY)
Admission: RE | Admit: 2015-06-21 | Payer: Commercial Managed Care - HMO | Source: Ambulatory Visit | Admitting: Gastroenterology

## 2015-06-21 HISTORY — DX: Anxiety disorder, unspecified: F41.9

## 2015-06-21 HISTORY — DX: Other specified postprocedural states: Z98.890

## 2015-06-21 HISTORY — DX: Other specified postprocedural states: R11.2

## 2015-06-21 HISTORY — DX: Essential (primary) hypertension: I10

## 2015-06-21 HISTORY — DX: Major depressive disorder, single episode, unspecified: F32.9

## 2015-06-21 HISTORY — DX: Personal history of other medical treatment: Z92.89

## 2015-06-21 HISTORY — DX: Depression, unspecified: F32.A

## 2015-06-21 HISTORY — DX: Reserved for inherently not codable concepts without codable children: IMO0001

## 2015-06-21 HISTORY — DX: Gastro-esophageal reflux disease without esophagitis: K21.9

## 2015-06-21 SURGERY — COLONOSCOPY WITH PROPOFOL
Anesthesia: Monitor Anesthesia Care

## 2015-08-23 ENCOUNTER — Other Ambulatory Visit: Payer: Self-pay | Admitting: Gastroenterology

## 2015-08-24 ENCOUNTER — Other Ambulatory Visit: Payer: Self-pay | Admitting: Gastroenterology

## 2015-10-18 ENCOUNTER — Encounter (HOSPITAL_COMMUNITY): Payer: Self-pay | Admitting: *Deleted

## 2015-10-25 ENCOUNTER — Ambulatory Visit (HOSPITAL_COMMUNITY): Payer: Commercial Managed Care - HMO | Admitting: Anesthesiology

## 2015-10-25 ENCOUNTER — Ambulatory Visit (HOSPITAL_COMMUNITY)
Admission: RE | Admit: 2015-10-25 | Discharge: 2015-10-25 | Disposition: A | Discharge status: Home / Self Care | Payer: Commercial Managed Care - HMO | Source: Ambulatory Visit | Attending: Gastroenterology | Admitting: Gastroenterology

## 2015-10-25 ENCOUNTER — Encounter (HOSPITAL_COMMUNITY)
Admission: RE | Disposition: A | Discharge status: Home / Self Care | Payer: Self-pay | Source: Ambulatory Visit | Attending: Gastroenterology

## 2015-10-25 ENCOUNTER — Encounter (HOSPITAL_COMMUNITY): Payer: Self-pay

## 2015-10-25 DIAGNOSIS — I10 Essential (primary) hypertension: Secondary | ICD-10-CM | POA: Insufficient documentation

## 2015-10-25 DIAGNOSIS — Z800 Family history of malignant neoplasm of digestive organs: Secondary | ICD-10-CM | POA: Diagnosis not present

## 2015-10-25 DIAGNOSIS — K219 Gastro-esophageal reflux disease without esophagitis: Secondary | ICD-10-CM | POA: Diagnosis not present

## 2015-10-25 DIAGNOSIS — K921 Melena: Secondary | ICD-10-CM | POA: Diagnosis not present

## 2015-10-25 HISTORY — PX: COLONOSCOPY WITH PROPOFOL: SHX5780

## 2015-10-25 SURGERY — COLONOSCOPY WITH PROPOFOL
Anesthesia: Monitor Anesthesia Care

## 2015-10-25 MED ORDER — LACTATED RINGERS IV SOLN
INTRAVENOUS | Status: DC
  Administered 2015-10-25: 1000 mL via INTRAVENOUS

## 2015-10-25 MED ORDER — PROPOFOL 10 MG/ML IV BOLUS
INTRAVENOUS | Status: DC | PRN
  Administered 2015-10-25 (×2): 30 mg via INTRAVENOUS

## 2015-10-25 MED ORDER — PROPOFOL 500 MG/50ML IV EMUL
INTRAVENOUS | Status: DC | PRN
  Administered 2015-10-25: 150 ug/kg/min via INTRAVENOUS

## 2015-10-25 MED ORDER — PROPOFOL 10 MG/ML IV BOLUS
INTRAVENOUS | Status: AC
  Filled 2015-10-25: qty 60

## 2015-10-25 MED ORDER — SODIUM CHLORIDE 0.9 % IV SOLN: INTRAVENOUS | Status: DC

## 2015-10-25 MED ORDER — LIDOCAINE HCL (CARDIAC) 20 MG/ML IV SOLN
INTRAVENOUS | Status: DC | PRN
  Administered 2015-10-25: 25 mg via INTRATRACHEAL

## 2015-10-25 SURGICAL SUPPLY — 22 items

## 2015-10-25 NOTE — Op Note (Signed)
[facility] Patient Name: Andrea Garza Procedure Date: 10/25/2015 MRN: [id number] Attending MD: Charolett Bumpers , MD Date of Birth: Nov 18, 1960 CSN: [id number] Age: 55 Admit Type: Outpatient Procedure:                Colonoscopy Indications:              Screening for colorectal malignant neoplasm.                            Intermittent painless hematochezia. 01/23/2008                            normal screening colonoscopy was performed. Father                            was diagnosed with colon cancer at age 68 Providers:                Charolett Bumpers, MD, Jacquiline Doe, RN, [facility], Technician, Delphia Grates, CRNA Referring MD:              Medicines:                Propofol per Anesthesia Complications:            No immediate complications. Estimated Blood Loss:     Estimated blood loss: none. Procedure:                Pre-Anesthesia Assessment:                           - Prior to the procedure, a History and Physical                            was performed, and patient medications and                            allergies were reviewed. The patient's tolerance of                            previous anesthesia was also reviewed. The risks                            and benefits of the procedure and the sedation                            options and risks were discussed with the patient.                            All questions were answered, and informed consent                            was obtained. Prior Anticoagulants: The patient has  taken no previous anticoagulant or antiplatelet                            agents. ASA Grade Assessment: II - A patient with                            mild systemic disease. After reviewing the risks                            and benefits, the patient was deemed in                            satisfactory condition to undergo the procedure.              After obtaining informed consent, the colonoscope                            was passed under direct vision. Throughout the                            procedure, the patient's blood pressure, pulse, and                            oxygen saturations were monitored continuously. The                            EC-[id number] ([id number]) scope was introduced through                            the anus and advanced to the the cecum, identified                            by appendiceal orifice and ileocecal valve. The                            colonoscopy was performed without difficulty. The                            patient tolerated the procedure well. The quality                            of the bowel preparation was good. The appendiceal                            orifice and the rectum were photographed. Scope In: 8:44:15 AM Scope Out: 9:02:59 AM Scope Withdrawal Time: 0 hours 6 minutes 14 seconds  Total Procedure Duration: 0 hours 18 minutes 44 seconds  Findings:      The perianal and digital rectal examinations were normal.      The entire examined colon appeared normal. Impression:               - The entire examined colon is normal.                           -  No specimens collected. Moderate Sedation:      N/A- Per Anesthesia Care Recommendation:           - Patient has a contact number available for                            emergencies. The signs and symptoms of potential                            delayed complications were discussed with the                            patient. Return to normal activities tomorrow.                            Written discharge instructions were provided to the                            patient.                           - Repeat colonoscopy in 10 years for screening                            purposes.                           - Resume previous diet.                           - Continue present medications. Procedure Code(s):         --- Professional ---                           [phone], Colonoscopy, flexible; diagnostic, including                            collection of specimen(s) by brushing or washing,                            when performed (separate procedure) Diagnosis Code(s):        --- Professional ---                           Z12.11, Encounter for screening for malignant                            neoplasm of colon CPT copyright 2016 [employer]. All rights reserved. The codes documented in this report are preliminary and upon coder review may  be revised to meet current compliance requirements. Danise EdgeMartin Nava Song, MD Charolett BumpersMartin K Elliana Bal, MD 10/25/2015 9:10:04 AM This report has been signed electronically. Number of Addenda: 0

## 2015-10-25 NOTE — Anesthesia Postprocedure Evaluation (Signed)
Anesthesia Post Note  Patient: Andrea Garza  Procedure(s) Performed: Procedure(s) (LRB): COLONOSCOPY WITH PROPOFOL (N/A)  Patient location during evaluation: PACU Anesthesia Type: MAC Level of consciousness: awake and alert Pain management: pain level controlled Vital Signs Assessment: post-procedure vital signs reviewed and stable Respiratory status: spontaneous breathing, nonlabored ventilation, respiratory function stable and patient connected to nasal cannula oxygen Cardiovascular status: stable and blood pressure returned to baseline Anesthetic complications: no    Last Vitals:  Filed Vitals:   10/25/15 0930 10/25/15 0940  BP: 147/78 161/91  Pulse: 66 73  Temp:    Resp: 17 17    Last Pain: There were no vitals filed for this visit.               [name] DAVID

## 2015-10-25 NOTE — Discharge Instructions (Signed)
Colonoscopy °A colonoscopy is an exam to look at your colon. This exam can help find lumps (tumors), growths (polyps), bleeding, and redness and puffiness (inflammation) in your colon.  °BEFORE THE PROCEDURE °· Ask your doctor about changing or stopping your regular medicines. °· You may need to drink a large amount of a special liquid (oral bowel prep). You start drinking this the day before your procedure. It will cause you to have watery poop (stool). This cleans out your colon. °· Do not eat or drink anything else once you have started the bowel prep, unless your doctor tells you it is safe to do so. °· Make plans for someone to drive you home after the procedure. °PROCEDURE °· You will be given medicine to help you relax (sedative). °· You will lie on your side with your knees bent. °· A tube with a camera on the end is put in the opening of your butt (anus) and into your colon. Pictures are sent to a computer screen. Your doctor will look for anything that is not normal. °· Your doctor may take a tissue sample (biopsy) from your colon to be looked at more closely. °· The exam is finished when your doctor has viewed all of the colon. °AFTER THE PROCEDURE °· Do not drive for 24 hours after the exam. °· You may have a small amount of blood in your poop. This is normal. °· You may pass gas and have belly (abdominal) cramps. This is normal. °· Ask when your test results will be ready. Make sure you get your test results. °  °This information is not intended to replace advice given to you by your health care provider. Make sure you discuss any questions you have with your health care provider. °  °Document Released: 04/29/2010 Document Revised: 04/01/2013 Document Reviewed: 12/02/2012 °Elsevier Interactive Patient Education ©2016 [employer] ° °

## 2015-10-25 NOTE — Anesthesia Preprocedure Evaluation (Signed)
Anesthesia Evaluation  Patient identified by MRN, date of birth, ID band Patient awake    Reviewed: Allergy & Precautions, NPO status , Patient's Chart, lab work & pertinent test results  History of Anesthesia Complications (+) PONV  Airway Mallampati: I  TM Distance: >3 FB Neck ROM: Full    Dental   Pulmonary    Pulmonary exam normal        Cardiovascular hypertension, Pt. on medications Normal cardiovascular exam     Neuro/Psych Anxiety Depression    GI/Hepatic GERD  Controlled and Medicated,  Endo/Other    Renal/GU      Musculoskeletal   Abdominal   Peds  Hematology   Anesthesia Other Findings   Reproductive/Obstetrics                             Anesthesia Physical Anesthesia Plan  ASA: II  Anesthesia Plan: MAC   Post-op Pain Management:    Induction: Intravenous  Airway Management Planned: Natural Airway  Additional Equipment:   Intra-op Plan:   Post-operative Plan:   Informed Consent: I have reviewed the patients History and Physical, chart, labs and discussed the procedure including the risks, benefits and alternatives for the proposed anesthesia with the patient or authorized representative who has indicated his/her understanding and acceptance.     Plan Discussed with: CRNA and Surgeon  Anesthesia Plan Comments:         Anesthesia Quick Evaluation

## 2015-10-25 NOTE — Transfer of Care (Signed)
Immediate Anesthesia Transfer of Care Note  Patient: Andrea Garza  Procedure(s) Performed: Procedure(s): COLONOSCOPY WITH PROPOFOL (N/A)  Patient Location: PACU and Endoscopy Unit  Anesthesia Type:MAC  Level of Consciousness: awake and patient cooperative  Airway & Oxygen Therapy: Patient Spontanous Breathing and Patient connected to face mask oxygen  Post-op Assessment: Report given to RN and Post -op Vital signs reviewed and stable  Post vital signs: Reviewed and stable  Last Vitals:  Filed Vitals:   10/25/15 0719  BP: 162/85  Pulse: 72  Temp: 36.8 C  Resp: 18    Last Pain: There were no vitals filed for this visit.       Complications: No apparent anesthesia complications

## 2015-10-25 NOTE — H&P (Signed)
  Problem: Hematochezia. 01/23/2008 normal screening colonoscopy was performed. Father was diagnosed with colon cancer in his late 4660s.  History: The patient is a 55 year old female born 25-Apr-1960. She is scheduled to undergo diagnostic colonoscopy to evaluate hematochezia.  Past medical history: Hypertension. Anxiety with depression. Gastroesophageal reflux. Insomnia. Deviated septum repair. Appendectomy. Hysterectomy. Laparoscopy.  Medication allergies: Morphine and diclofenac  Exam: The patient is alert and lying comfortably on the endoscopy stretcher. Abdomen is soft and nontender to palpation. Lungs are clear to auscultation. Cardiac exam reveals a regular rhythm.  Plan: Proceed with diagnostic colonoscopy

## 2015-10-26 ENCOUNTER — Encounter (HOSPITAL_COMMUNITY): Payer: Self-pay | Admitting: Gastroenterology

## 2016-04-18 DIAGNOSIS — E782 Mixed hyperlipidemia: Secondary | ICD-10-CM | POA: Diagnosis not present

## 2016-04-18 DIAGNOSIS — I10 Essential (primary) hypertension: Secondary | ICD-10-CM | POA: Diagnosis not present

## 2016-04-18 DIAGNOSIS — J069 Acute upper respiratory infection, unspecified: Secondary | ICD-10-CM | POA: Diagnosis not present

## 2016-04-18 DIAGNOSIS — R7309 Other abnormal glucose: Secondary | ICD-10-CM | POA: Diagnosis not present

## 2016-04-18 DIAGNOSIS — R5383 Other fatigue: Secondary | ICD-10-CM | POA: Diagnosis not present

## 2016-08-29 DIAGNOSIS — H5203 Hypermetropia, bilateral: Secondary | ICD-10-CM | POA: Diagnosis not present

## 2016-09-21 DIAGNOSIS — R7303 Prediabetes: Secondary | ICD-10-CM | POA: Diagnosis not present

## 2016-09-21 DIAGNOSIS — K219 Gastro-esophageal reflux disease without esophagitis: Secondary | ICD-10-CM | POA: Diagnosis not present

## 2016-09-21 DIAGNOSIS — I10 Essential (primary) hypertension: Secondary | ICD-10-CM | POA: Diagnosis not present

## 2017-03-06 DIAGNOSIS — Z23 Encounter for immunization: Secondary | ICD-10-CM | POA: Diagnosis not present

## 2017-03-06 DIAGNOSIS — J018 Other acute sinusitis: Secondary | ICD-10-CM | POA: Diagnosis not present

## 2017-03-13 DIAGNOSIS — Z803 Family history of malignant neoplasm of breast: Secondary | ICD-10-CM | POA: Diagnosis not present

## 2017-03-13 DIAGNOSIS — Z808 Family history of malignant neoplasm of other organs or systems: Secondary | ICD-10-CM | POA: Diagnosis not present

## 2017-03-13 DIAGNOSIS — Z01419 Encounter for gynecological examination (general) (routine) without abnormal findings: Secondary | ICD-10-CM | POA: Diagnosis not present

## 2017-03-13 DIAGNOSIS — Z800 Family history of malignant neoplasm of digestive organs: Secondary | ICD-10-CM | POA: Diagnosis not present

## 2017-03-14 ENCOUNTER — Other Ambulatory Visit: Payer: Self-pay | Admitting: Obstetrics and Gynecology

## 2017-03-14 DIAGNOSIS — N6320 Unspecified lump in the left breast, unspecified quadrant: Secondary | ICD-10-CM

## 2017-03-16 ENCOUNTER — Ambulatory Visit
Admission: RE | Admit: 2017-03-16 | Discharge: 2017-03-16 | Disposition: A | Discharge status: Home / Self Care | Payer: 59 | Source: Ambulatory Visit | Attending: Obstetrics and Gynecology | Admitting: Obstetrics and Gynecology

## 2017-03-16 ENCOUNTER — Ambulatory Visit
Admission: RE | Admit: 2017-03-16 | Discharge: 2017-03-16 | Disposition: A | Discharge status: Home / Self Care | Payer: Commercial Managed Care - HMO | Source: Ambulatory Visit | Attending: Obstetrics and Gynecology | Admitting: Obstetrics and Gynecology

## 2017-03-16 DIAGNOSIS — N6489 Other specified disorders of breast: Secondary | ICD-10-CM | POA: Diagnosis not present

## 2017-03-16 DIAGNOSIS — N6320 Unspecified lump in the left breast, unspecified quadrant: Secondary | ICD-10-CM

## 2017-04-09 DIAGNOSIS — I10 Essential (primary) hypertension: Secondary | ICD-10-CM | POA: Diagnosis not present

## 2017-04-09 DIAGNOSIS — E782 Mixed hyperlipidemia: Secondary | ICD-10-CM | POA: Diagnosis not present

## 2017-04-09 DIAGNOSIS — R7303 Prediabetes: Secondary | ICD-10-CM | POA: Diagnosis not present

## 2017-05-15 DIAGNOSIS — Z13820 Encounter for screening for osteoporosis: Secondary | ICD-10-CM | POA: Diagnosis not present

## 2017-07-10 DIAGNOSIS — I872 Venous insufficiency (chronic) (peripheral): Secondary | ICD-10-CM | POA: Diagnosis not present

## 2017-07-10 DIAGNOSIS — J301 Allergic rhinitis due to pollen: Secondary | ICD-10-CM | POA: Diagnosis not present

## 2017-07-24 DIAGNOSIS — R87612 Low grade squamous intraepithelial lesion on cytologic smear of cervix (LGSIL): Secondary | ICD-10-CM | POA: Diagnosis not present

## 2017-08-27 DIAGNOSIS — R079 Chest pain, unspecified: Secondary | ICD-10-CM | POA: Diagnosis not present

## 2017-08-27 DIAGNOSIS — I10 Essential (primary) hypertension: Secondary | ICD-10-CM | POA: Diagnosis not present

## 2017-10-15 DIAGNOSIS — Z0000 Encounter for general adult medical examination without abnormal findings: Secondary | ICD-10-CM | POA: Diagnosis not present

## 2017-10-15 DIAGNOSIS — E782 Mixed hyperlipidemia: Secondary | ICD-10-CM | POA: Diagnosis not present

## 2017-10-15 DIAGNOSIS — Z0184 Encounter for antibody response examination: Secondary | ICD-10-CM | POA: Diagnosis not present

## 2017-10-15 DIAGNOSIS — Z1159 Encounter for screening for other viral diseases: Secondary | ICD-10-CM | POA: Diagnosis not present

## 2017-10-15 DIAGNOSIS — Z23 Encounter for immunization: Secondary | ICD-10-CM | POA: Diagnosis not present

## 2017-10-15 DIAGNOSIS — I10 Essential (primary) hypertension: Secondary | ICD-10-CM | POA: Diagnosis not present

## 2017-10-15 DIAGNOSIS — R7303 Prediabetes: Secondary | ICD-10-CM | POA: Diagnosis not present

## 2018-03-03 DIAGNOSIS — J069 Acute upper respiratory infection, unspecified: Secondary | ICD-10-CM | POA: Diagnosis not present

## 2018-03-18 DIAGNOSIS — Z01419 Encounter for gynecological examination (general) (routine) without abnormal findings: Secondary | ICD-10-CM | POA: Diagnosis not present

## 2018-03-18 DIAGNOSIS — Z6841 Body Mass Index (BMI) 40.0 and over, adult: Secondary | ICD-10-CM | POA: Diagnosis not present

## 2018-04-11 DIAGNOSIS — Z01419 Encounter for gynecological examination (general) (routine) without abnormal findings: Secondary | ICD-10-CM | POA: Diagnosis not present

## 2018-04-11 DIAGNOSIS — Z6841 Body Mass Index (BMI) 40.0 and over, adult: Secondary | ICD-10-CM | POA: Diagnosis not present

## 2018-04-30 DIAGNOSIS — J209 Acute bronchitis, unspecified: Secondary | ICD-10-CM | POA: Diagnosis not present

## 2018-05-14 DIAGNOSIS — E782 Mixed hyperlipidemia: Secondary | ICD-10-CM | POA: Diagnosis not present

## 2018-05-14 DIAGNOSIS — I10 Essential (primary) hypertension: Secondary | ICD-10-CM | POA: Diagnosis not present

## 2018-05-14 DIAGNOSIS — R7303 Prediabetes: Secondary | ICD-10-CM | POA: Diagnosis not present

## 2018-06-19 DIAGNOSIS — E8881 Metabolic syndrome: Secondary | ICD-10-CM | POA: Diagnosis not present

## 2018-06-19 DIAGNOSIS — R5383 Other fatigue: Secondary | ICD-10-CM | POA: Diagnosis not present

## 2018-06-19 DIAGNOSIS — R635 Abnormal weight gain: Secondary | ICD-10-CM | POA: Diagnosis not present

## 2018-06-19 DIAGNOSIS — R0602 Shortness of breath: Secondary | ICD-10-CM | POA: Diagnosis not present

## 2018-06-19 DIAGNOSIS — E7800 Pure hypercholesterolemia, unspecified: Secondary | ICD-10-CM | POA: Diagnosis not present

## 2018-06-19 DIAGNOSIS — E559 Vitamin D deficiency, unspecified: Secondary | ICD-10-CM | POA: Diagnosis not present

## 2018-06-19 DIAGNOSIS — Z79899 Other long term (current) drug therapy: Secondary | ICD-10-CM | POA: Diagnosis not present

## 2019-08-12 ENCOUNTER — Ambulatory Visit: Payer: 59

## 2019-08-19 ENCOUNTER — Ambulatory Visit: Payer: 59

## 2019-08-26 ENCOUNTER — Ambulatory Visit: Payer: 59

## 2019-09-10 ENCOUNTER — Ambulatory Visit (INDEPENDENT_AMBULATORY_CARE_PROVIDER_SITE_OTHER): Payer: 59 | Admitting: Bariatrics

## 2019-09-10 ENCOUNTER — Encounter (INDEPENDENT_AMBULATORY_CARE_PROVIDER_SITE_OTHER): Payer: Self-pay | Admitting: Bariatrics

## 2019-09-10 ENCOUNTER — Other Ambulatory Visit: Payer: Self-pay

## 2019-09-10 VITALS — BP 160/88 | HR 78 | Temp 98.3°F | Ht 64.0 in | Wt 312.0 lb

## 2019-09-10 DIAGNOSIS — K219 Gastro-esophageal reflux disease without esophagitis: Secondary | ICD-10-CM | POA: Diagnosis not present

## 2019-09-10 DIAGNOSIS — Z8616 Personal history of COVID-19: Secondary | ICD-10-CM

## 2019-09-10 DIAGNOSIS — E119 Type 2 diabetes mellitus without complications: Secondary | ICD-10-CM

## 2019-09-10 DIAGNOSIS — R5383 Other fatigue: Secondary | ICD-10-CM | POA: Diagnosis not present

## 2019-09-10 DIAGNOSIS — Z1331 Encounter for screening for depression: Secondary | ICD-10-CM | POA: Diagnosis not present

## 2019-09-10 DIAGNOSIS — R0602 Shortness of breath: Secondary | ICD-10-CM | POA: Diagnosis not present

## 2019-09-10 DIAGNOSIS — Z6841 Body Mass Index (BMI) 40.0 and over, adult: Secondary | ICD-10-CM

## 2019-09-10 DIAGNOSIS — Z0289 Encounter for other administrative examinations: Secondary | ICD-10-CM

## 2019-09-10 DIAGNOSIS — E669 Obesity, unspecified: Secondary | ICD-10-CM | POA: Insufficient documentation

## 2019-09-10 DIAGNOSIS — I10 Essential (primary) hypertension: Secondary | ICD-10-CM | POA: Diagnosis not present

## 2019-09-10 DIAGNOSIS — E1169 Type 2 diabetes mellitus with other specified complication: Secondary | ICD-10-CM

## 2019-09-10 DIAGNOSIS — Z9189 Other specified personal risk factors, not elsewhere classified: Secondary | ICD-10-CM | POA: Diagnosis not present

## 2019-09-10 NOTE — Progress Notes (Signed)
Chief Complaint:   OBESITY Andrea Garza (MR# [id number]) is a 59 y.o. female who presents for evaluation and treatment of obesity and related comorbidities. Current BMI is Body mass index is 53.55 kg/m.Marland Kitchen Andrea Garza has been struggling with her weight for many years and has been unsuccessful in either losing weight, maintaining weight loss, or reaching her healthy weight goal.  Andrea Garza is currently in the action stage of change and ready to dedicate time achieving and maintaining a healthier weight. Andrea Garza is interested in becoming our patient and working on intensive lifestyle modifications including (but not limited to) diet and exercise for weight loss.  Andrea Garza is lactose intolerant. She goes out to eat 5-6 times a week.  Andrea Garza's habits were reviewed today and are as follows: Her family eats meals together, she thinks her family will eat healthier with her, her desired weight loss is 112 lbs, she has been heavy most of her life, she started gaining weight at age 59, her heaviest weight ever was 312 pounds, she craves salty and carbs, she eats fruit before bed, she frequently makes poor food choices, she frequently eats larger portions than normal, she has binge eating behaviors and she struggles with emotional eating.  Depression Screen Andrea Garza's Food and Mood (modified PHQ-9) score was 18.  Depression screen PHQ 2/9 09/10/2019  Decreased Interest 2  Down, Depressed, Hopeless 2  PHQ - 2 Score 4  Altered sleeping 3  Tired, decreased energy 3  Change in appetite 3  Feeling bad or failure about yourself  2  Trouble concentrating 1  Moving slowly or fidgety/restless 1  Suicidal thoughts 1  PHQ-9 Score 18  Difficult doing work/chores Somewhat difficult   Subjective:   Other fatigue. Andrea Garza denies daytime somnolence and admits to waking up still tired. Andrea Garza generally gets 8 hours of sleep per night, and states that she does not sleep well most nights. Snoring is present. Apneic episodes are  not present. Epworth Sleepiness Score is 5.  SOB (shortness of breath) on exertion. Andrea Garza notes increasing shortness of breath with certain activities and seems to be worsening over time with weight gain. She notes getting out of breath sooner with activity than she used to. This has gotten worse recently. Andrea Garza denies shortness of breath at rest or orthopnea.  Essential hypertension. Andrea Garza is taking amlodipine, HCTZ, and losartan. Blood pressure is elevated today at 160/88 (increased anxiety this a.m.). Blood pressure is normally well controlled.  BP Readings from Last 3 Encounters:  09/10/19 (!) 160/88  10/25/15 (!) 161/91   Lab Results  Component Value Date   CREATININE 0.51 01/12/2011   CREATININE 0.57 01/11/2011   CREATININE 0.62 01/10/2011   Gastroesophageal reflux disease, unspecified whether esophagitis present. Andrea Garza is taking omeprazole.  Type 2 diabetes mellitus without complication, without long-term current use of insulin (HCC). Andrea Garza is on no medication. A1c 6.7 on 06/23/2019.  No results found for: HGBA1C Lab Results  Component Value Date   CREATININE 0.51 01/12/2011   No results found for: INSULIN  History of COVID-19. Andrea Garza reports mild signs and symptoms 06/01/2019 and lost taste and smell. She was not hospitalized. No monoclonal antibodies.  Depression screen. Andrea Garza had a strongly positive depression screen with a PHQ-9 score of 18.  At risk for heart disease. Andrea Garza is at a higher than average risk for cardiovascular disease due to hypertension and obesity.   Assessment/Plan:   Other fatigue. Andrea Garza does feel that her weight is causing her energy to be  lower than it should be. Fatigue may be related to obesity, depression or many other causes. Labs will be ordered, and in the meanwhile, Andrea Garza will focus on self care including making healthy food choices, increasing physical activity and focusing on stress reduction. EKG 12-Lead, VITAMIN D 25 Hydroxy (Vit-D  Deficiency, Fractures), Vitamin B12 testing ordered today.  SOB (shortness of breath) on exertion. Andrea Garza does feel that she gets out of breath more easily that she used to when she exercises. Andrea Garza's shortness of breath appears to be obesity related and exercise induced. She has agreed to work on weight loss and gradually increase exercise to treat her exercise induced shortness of breath. Will continue to monitor closely.  Essential hypertension. Andrea Garza is working on healthy weight loss and exercise to improve blood pressure control. We will watch for signs of hypotension as she continues her lifestyle modifications. She will continue her medications as directed.   Gastroesophageal reflux disease, unspecified whether esophagitis present. Intensive lifestyle modifications are the first line treatment for this issue. We discussed several lifestyle modifications today and she will continue to work on diet, exercise and weight loss efforts. Orders and follow up as documented in patient record. She will continue her medication as directed.   Counseling . If a person has gastroesophageal reflux disease (GERD), food and stomach acid move back up into the esophagus and cause symptoms or problems such as damage to the esophagus. . Anti-reflux measures include: raising the head of the bed, avoiding tight clothing or belts, avoiding eating late at night, not lying down shortly after mealtime, and achieving weight loss. . Avoid ASA, NSAID's, caffeine, alcohol, and tobacco.  . OTC Pepcid and/or Tums are often very helpful for as needed use.  Marland Kitchen However, for persisting chronic or daily symptoms, stronger medications like Omeprazole may be needed. . You may need to avoid foods and drinks such as: ? Coffee and tea (with or without caffeine). ? Drinks that contain alcohol. ? Energy drinks and sports drinks. ? Bubbly (carbonated) drinks or sodas. ? Chocolate and cocoa. ? Peppermint and mint flavorings. ? Garlic  and onions. ? Horseradish. ? Spicy and acidic foods. These include peppers, chili powder, curry powder, vinegar, hot sauces, and BBQ sauce. ? Citrus fruit juices and citrus fruits, such as oranges, lemons, and limes. ? Tomato-based foods. These include red sauce, chili, salsa, and pizza with red sauce. ? Fried and fatty foods. These include donuts, french fries, potato chips, and high-fat dressings. ? High-fat meats. These include hot dogs, rib eye steak, sausage, ham, and bacon.  Type 2 diabetes mellitus without complication, without long-term current use of insulin ([place]). Good blood sugar control is important to decrease the likelihood of diabetic complications such as nephropathy, neuropathy, limb loss, blindness, coronary artery disease, and death. Intensive lifestyle modification including diet, exercise and weight loss are the first line of treatment for diabetes. We discussed diabetes mellitus type II.  History of COVID-19. Andrea Garza(IGG)IA will be drawn today.  Depression screen. Andrea Garza had a positive depression screening. Depression is commonly associated with obesity and often results in emotional eating behaviors. We will monitor this closely and work on CBT to help improve the non-hunger eating patterns. Referral to Psychology may be required if no improvement is seen as she continues in our clinic.  At risk for heart disease. Andrea Garza was given approximately 15 minutes of coronary artery disease prevention counseling today. She is 59 y.o. female and has risk factors for heart disease  including obesity. We discussed intensive lifestyle modifications today with an emphasis on specific weight loss instructions and strategies.   Repetitive spaced learning was employed today to elicit superior memory formation and behavioral change.  Class 3 severe obesity with serious comorbidity and body mass index (BMI) of 50.0 to 59.9 in adult, unspecified obesity type (HCC).  Andrea Garza  is currently in the action stage of change and her goal is to continue with weight loss efforts. I recommend Andrea Garza begin the structured treatment plan as follows:  She has agreed to the Category 4 Plan minus 100 calories.  She will stop all sugary drinks, work on meal planning and intentional eating.  We reviewed with the patient labs including CMP, A1c, TSH, CBC, and lipids.  Exercise goals: All adults should avoid inactivity. Some physical activity is better than none, and adults who participate in any amount of physical activity gain some health benefits.   Behavioral modification strategies: increasing lean protein intake, decreasing simple carbohydrates, increasing vegetables, increasing water intake, decreasing eating out, no skipping meals, meal planning and cooking strategies, keeping healthy foods in the home and planning for success.  She was informed of the importance of frequent follow-up visits to maximize her success with intensive lifestyle modifications for her multiple health conditions. She was informed we would discuss her lab results at her next visit unless there is a critical issue that needs to be addressed sooner. Andrea Garza agreed to keep her next visit at the agreed upon time to discuss these results.  Objective:   Blood pressure (!) 160/88, pulse 78, temperature 98.3 F (36.8 C), temperature source Oral, height 5\' 4"  (1.626 m), weight (!) 312 lb (141.5 kg), SpO2 97 %. Body mass index is 53.55 kg/m.  EKG: Sinus  Rhythm with a rate of 73 BPM. Low voltage in precordial leads. Nonspecific T-abnormality.  Otherwise normal.  Indirect Calorimeter completed today shows a VO2 of 328 and a REE of 2281.  Her calculated basal metabolic rate is 2282 thus her basal metabolic rate is better than expected.  General: Cooperative, alert, well developed, in no acute distress. HEENT: Conjunctivae and lids unremarkable. Cardiovascular: Regular rhythm.  Lungs: Normal work of  breathing. Neurologic: No focal deficits.   Lab Results  Component Value Date   CREATININE 0.51 01/12/2011   BUN 6 01/12/2011   NA 136 01/12/2011   K 3.4 (L) 01/12/2011   CL 102 01/12/2011   CO2 30 01/12/2011   Lab Results  Component Value Date   ALT 11 01/10/2011   AST 12 01/10/2011   ALKPHOS 71 01/10/2011   BILITOT 0.4 01/10/2011   No results found for: HGBA1C No results found for: INSULIN Lab Results  Component Value Date   TSH 1.309 01/09/2011   No results found for: CHOL, HDL, LDLCALC, LDLDIRECT, TRIG, CHOLHDL Lab Results  Component Value Date   WBC 9.4 01/12/2011   HGB 8.8 (L) 01/12/2011   HCT 26.0 (L) 01/12/2011   MCV 93.2 01/12/2011   PLT 178 01/12/2011   No results found for: IRON, TIBC, FERRITIN  Attestation Statements:   Reviewed by clinician on day of visit: allergies, medications, problem list, medical history, surgical history, family history, social history, and previous encounter notes.  03/14/2011, am acting as Fernanda Drum for Energy manager, DO   I have reviewed the above documentation for accuracy and completeness, and I agree with the above. [employer], DO

## 2019-09-11 ENCOUNTER — Encounter (INDEPENDENT_AMBULATORY_CARE_PROVIDER_SITE_OTHER): Payer: Self-pay | Admitting: Bariatrics

## 2019-09-11 DIAGNOSIS — E559 Vitamin D deficiency, unspecified: Secondary | ICD-10-CM | POA: Insufficient documentation

## 2019-09-11 LAB — VITAMIN B12: Vitamin B-12: 284 pg/mL (ref 232–1245)

## 2019-09-11 LAB — VITAMIN D 25 HYDROXY (VIT D DEFICIENCY, FRACTURES): Vit D, 25-Hydroxy: 22.8 ng/mL — ABNORMAL LOW (ref 30.0–100.0)

## 2019-09-11 LAB — SAR COV2 SEROLOGY (COVID19)AB(IGG),IA: DiaSorin SARS-CoV-2 Ab, IgG: POSITIVE

## 2019-09-11 NOTE — Progress Notes (Signed)
Left msg for pt  to c/b re: labs

## 2019-09-11 NOTE — Progress Notes (Signed)
Pt called back and was advised of lab results

## 2019-09-24 ENCOUNTER — Ambulatory Visit (INDEPENDENT_AMBULATORY_CARE_PROVIDER_SITE_OTHER): Payer: 59 | Admitting: Bariatrics

## 2019-09-24 ENCOUNTER — Other Ambulatory Visit: Payer: Self-pay

## 2019-09-24 ENCOUNTER — Encounter (INDEPENDENT_AMBULATORY_CARE_PROVIDER_SITE_OTHER): Payer: Self-pay | Admitting: Bariatrics

## 2019-09-24 VITALS — BP 114/76 | HR 76 | Temp 97.9°F | Ht 64.0 in | Wt 310.0 lb

## 2019-09-24 DIAGNOSIS — Z9189 Other specified personal risk factors, not elsewhere classified: Secondary | ICD-10-CM

## 2019-09-24 DIAGNOSIS — Z6841 Body Mass Index (BMI) 40.0 and over, adult: Secondary | ICD-10-CM

## 2019-09-24 DIAGNOSIS — E559 Vitamin D deficiency, unspecified: Secondary | ICD-10-CM

## 2019-09-24 DIAGNOSIS — I10 Essential (primary) hypertension: Secondary | ICD-10-CM | POA: Diagnosis not present

## 2019-09-24 MED ORDER — VITAMIN D (ERGOCALCIFEROL) 1.25 MG (50000 UNIT) PO CAPS: 50000.0000 [IU] | ORAL_CAPSULE | ORAL | 0 refills | Status: DC

## 2019-09-25 ENCOUNTER — Encounter (INDEPENDENT_AMBULATORY_CARE_PROVIDER_SITE_OTHER): Payer: Self-pay | Admitting: Bariatrics

## 2019-09-25 NOTE — Progress Notes (Signed)
Chief Complaint:   OBESITY DALY WHIPKEY is here to discuss her progress with her obesity treatment plan along with follow-up of her obesity related diagnoses. Laneshia is on the Category 4 Plan minus 100 calories and states she is following her eating plan approximately 95% of the time. Teila states she is exercising 0 minutes 0 times per week.  Today's visit was #: 2 Starting weight: 312 lbs Starting date: 09/10/2019 Today's weight: 310 lbs Today's date: 09/24/2019 Total lbs lost to date: 2 Total lbs lost since last in-office visit: 2  Interim History: Makaylyn is down 2 lbs. She states that she does not struggle with the plan.  Subjective:   Vitamin D deficiency. Leilany is not on Vitamin D supplementation. Last Vitamin D was 22.8 on 09/10/2019.  Essential hypertension. Akyra is taking Norvasc, HCTZ, and Cozaar. Blood pressure is controlled.  BP Readings from Last 3 Encounters:  09/24/19 114/76  09/10/19 (!) 160/88  10/25/15 (!) 161/91   Lab Results  Component Value Date   CREATININE 0.51 01/12/2011   CREATININE 0.57 01/11/2011   CREATININE 0.62 01/10/2011   At risk for osteoporosis. Maddie is at higher risk of osteopenia and osteoporosis due to Vitamin D deficiency.   Assessment/Plan:   Vitamin D deficiency. Low Vitamin D level contributes to fatigue and are associated with obesity, breast, and colon cancer. She was given a prescription for Vitamin D, Ergocalciferol, (DRISDOL) 1.25 MG (50000 UNIT) CAPS capsule every week #4 with 0 refills and will follow-up for routine testing of Vitamin D, at least 2-3 times per year to avoid over-replacement.   Essential hypertension. Dyan is working on healthy weight loss and exercise to improve blood pressure control. We will watch for signs of hypotension as she continues her lifestyle modifications. She will continue her medications as directed.   At risk for osteoporosis. Emberleigh was given approximately 15 minutes of osteoporosis  prevention counseling today. Sharayah is at risk for osteopenia and osteoporosis due to her Vitamin D deficiency. She was encouraged to take her Vitamin D and follow her higher calcium diet and increase strengthening exercise to help strengthen her bones and decrease her risk of osteopenia and osteoporosis.  Repetitive spaced learning was employed today to elicit superior memory formation and behavioral change.  Class 3 severe obesity with serious comorbidity and body mass index (BMI) of 50.0 to 59.9 in adult, unspecified obesity type ([place]).  Terrill is currently in the action stage of change. As such, her goal is to continue with weight loss efforts. She has agreed to the Category 4 Plan minus 100 calories. Handout on Protein Equivalents was given.  She will work on meal planning and increasing her water intake to 64 oz daily.  We reviewed with the patient labs from 09/10/2019 including Vitamin D, B12, and SARS-COVID antibodies.  Exercise goals: All adults should avoid inactivity. Some physical activity is better than none, and adults who participate in any amount of physical activity gain some health benefits.  Behavioral modification strategies: increasing lean protein intake, decreasing simple carbohydrates, increasing vegetables, increasing water intake, decreasing eating out, no skipping meals, meal planning and cooking strategies, keeping healthy foods in the home and planning for success.  Emmaline has agreed to follow-up with our clinic in 2 weeks. She was informed of the importance of frequent follow-up visits to maximize her success with intensive lifestyle modifications for her multiple health conditions.   Objective:   Blood pressure 114/76, pulse 76, temperature 97.9 F (36.6  C), height 5\' 4"  (1.626 m), weight (!) 310 lb (140.6 kg), SpO2 94 %. Body mass index is 53.21 kg/m.  General: Cooperative, alert, well developed, in no acute distress. HEENT: Conjunctivae and lids  unremarkable. Cardiovascular: Regular rhythm.  Lungs: Normal work of breathing. Neurologic: No focal deficits.   Lab Results  Component Value Date   CREATININE 0.51 01/12/2011   BUN 6 01/12/2011   NA 136 01/12/2011   K 3.4 (L) 01/12/2011   CL 102 01/12/2011   CO2 30 01/12/2011   Lab Results  Component Value Date   ALT 11 01/10/2011   AST 12 01/10/2011   ALKPHOS 71 01/10/2011   BILITOT 0.4 01/10/2011   No results found for: HGBA1C No results found for: INSULIN Lab Results  Component Value Date   TSH 1.309 01/09/2011   No results found for: CHOL, HDL, LDLCALC, LDLDIRECT, TRIG, CHOLHDL Lab Results  Component Value Date   WBC 9.4 01/12/2011   HGB 8.8 (L) 01/12/2011   HCT 26.0 (L) 01/12/2011   MCV 93.2 01/12/2011   PLT 178 01/12/2011   No results found for: IRON, TIBC, FERRITIN  Attestation Statements:   Reviewed by clinician on day of visit: allergies, medications, problem list, medical history, surgical history, family history, social history, and previous encounter notes.  03/14/2011, am acting as Fernanda Drum for Energy manager, DO   I have reviewed the above documentation for accuracy and completeness, and I agree with the above. [employer], DO

## 2019-10-16 ENCOUNTER — Encounter (INDEPENDENT_AMBULATORY_CARE_PROVIDER_SITE_OTHER): Payer: Self-pay | Admitting: Bariatrics

## 2019-10-16 ENCOUNTER — Ambulatory Visit (INDEPENDENT_AMBULATORY_CARE_PROVIDER_SITE_OTHER): Payer: 59 | Admitting: Bariatrics

## 2019-10-16 ENCOUNTER — Other Ambulatory Visit: Payer: Self-pay

## 2019-10-16 VITALS — BP 132/73 | HR 76 | Temp 97.9°F | Ht 64.0 in | Wt 312.0 lb

## 2019-10-16 DIAGNOSIS — E669 Obesity, unspecified: Secondary | ICD-10-CM

## 2019-10-16 DIAGNOSIS — E1169 Type 2 diabetes mellitus with other specified complication: Secondary | ICD-10-CM

## 2019-10-16 DIAGNOSIS — Z6841 Body Mass Index (BMI) 40.0 and over, adult: Secondary | ICD-10-CM

## 2019-10-16 DIAGNOSIS — I10 Essential (primary) hypertension: Secondary | ICD-10-CM | POA: Diagnosis not present

## 2019-10-16 NOTE — Progress Notes (Signed)
Chief Complaint:   OBESITY Andrea Garza is here to discuss her progress with her obesity treatment plan along with follow-up of her obesity related diagnoses. Andrea Garza is on the Category 4 Plan minus 100 calories and states Andrea Garza is following her eating plan approximately 80% of the time. Andrea Garza states Andrea Garza is walking 2 hours 5 times per week.  Today's visit was #: 3 Starting weight: 312 lbs Starting date: 09/10/2019 Today's weight: 312 lbs Today's date: 10/16/2019 Total lbs lost to date: 0 Total lbs lost since last in-office visit: 0  Interim History: Andrea Garza is up 2 lbs. Andrea Garza has been on vacation. Andrea Garza reports doing well with her water intake.  Subjective:   Diabetes mellitus type 2 in obese (HCC). Andrea Garza is on no medication.  No results found for: HGBA1C Lab Results  Component Value Date   CREATININE 0.51 01/12/2011   No results found for: INSULIN  Essential hypertension. Andrea Garza is taking HCTZ, Norvasc, and Cozaar. Blood pressure is controlled.  BP Readings from Last 3 Encounters:  10/16/19 132/73  09/24/19 114/76  09/10/19 (!) 160/88   Lab Results  Component Value Date   CREATININE 0.51 01/12/2011   CREATININE 0.57 01/11/2011   CREATININE 0.62 01/10/2011   Assessment/Plan:   Diabetes mellitus type 2 in obese (HCC). Good blood sugar control is important to decrease the likelihood of diabetic complications such as nephropathy, neuropathy, limb loss, blindness, coronary artery disease, and death. Intensive lifestyle modification including diet, exercise and weight loss are the first line of treatment for diabetes. Andrea Garza will decrease carbohydrates and increase healthy fats and protein.  Essential hypertension. Andrea Garza is working on healthy weight loss and exercise to improve blood pressure control. We will watch for signs of hypotension as Andrea Garza continues her lifestyle modifications. Andrea Garza will continue her medication as directed.  Class 3 severe obesity with serious comorbidity  and body mass index (BMI) of 50.0 to 59.9 in adult, unspecified obesity type (HCC).  Andrea Garza is currently in the action stage of change. As such, her goal is to continue with weight loss efforts. Andrea Garza has agreed to the Category 4 Plan.   Andrea Garza will work on meal planning, intentional eating, being more adherent to the plan, and increasing her intake of raw vegetables.  Exercise goals: Andrea Garza will continue walking for exercise.  Behavioral modification strategies: increasing lean protein intake, decreasing simple carbohydrates, increasing vegetables, increasing water intake, decreasing eating out, no skipping meals, meal planning and cooking strategies, keeping healthy foods in the home and planning for success.  Andrea Garza has agreed to follow-up with our clinic in 2 weeks. Andrea Garza was informed of the importance of frequent follow-up visits to maximize her success with intensive lifestyle modifications for her multiple health conditions.   Objective:   Blood pressure 132/73, pulse 76, temperature 97.9 F (36.6 C), height 5\' 4"  (1.626 m), weight (!) 312 lb (141.5 kg), SpO2 96 %. Body mass index is 53.55 kg/m.  General: Cooperative, alert, well developed, in no acute distress. HEENT: Conjunctivae and lids unremarkable. Cardiovascular: Regular rhythm.  Lungs: Normal work of breathing. Neurologic: No focal deficits.   Lab Results  Component Value Date   CREATININE 0.51 01/12/2011   BUN 6 01/12/2011   NA 136 01/12/2011   K 3.4 (L) 01/12/2011   CL 102 01/12/2011   CO2 30 01/12/2011   Lab Results  Component Value Date   ALT 11 01/10/2011   AST 12 01/10/2011   ALKPHOS 71 01/10/2011   BILITOT 0.4  01/10/2011   No results found for: HGBA1C No results found for: INSULIN Lab Results  Component Value Date   TSH 1.309 01/09/2011   No results found for: CHOL, HDL, LDLCALC, LDLDIRECT, TRIG, CHOLHDL Lab Results  Component Value Date   WBC 9.4 01/12/2011   HGB 8.8 (L) 01/12/2011   HCT 26.0 (L)  01/12/2011   MCV 93.2 01/12/2011   PLT 178 01/12/2011   No results found for: IRON, TIBC, FERRITIN  Attestation Statements:   Reviewed by clinician on day of visit: allergies, medications, problem list, medical history, surgical history, family history, social history, and previous encounter notes.  Time spent on visit including pre-visit chart review and post-visit charting and care was 20 minutes.   Fernanda Drum, am acting as Energy manager for [employer], DO   I have reviewed the above documentation for accuracy and completeness, and I agree with the above. Corinna Capra, DO

## 2019-11-06 ENCOUNTER — Other Ambulatory Visit: Payer: Self-pay

## 2019-11-06 ENCOUNTER — Ambulatory Visit (INDEPENDENT_AMBULATORY_CARE_PROVIDER_SITE_OTHER): Payer: 59 | Admitting: Bariatrics

## 2019-11-06 ENCOUNTER — Encounter (INDEPENDENT_AMBULATORY_CARE_PROVIDER_SITE_OTHER): Payer: Self-pay | Admitting: Bariatrics

## 2019-11-06 VITALS — BP 131/70 | HR 71 | Temp 98.0°F | Ht 64.0 in | Wt 311.0 lb

## 2019-11-06 DIAGNOSIS — E559 Vitamin D deficiency, unspecified: Secondary | ICD-10-CM

## 2019-11-06 DIAGNOSIS — Z9189 Other specified personal risk factors, not elsewhere classified: Secondary | ICD-10-CM | POA: Diagnosis not present

## 2019-11-06 DIAGNOSIS — E66813 Obesity, class 3: Secondary | ICD-10-CM

## 2019-11-06 DIAGNOSIS — I10 Essential (primary) hypertension: Secondary | ICD-10-CM | POA: Diagnosis not present

## 2019-11-06 DIAGNOSIS — Z6841 Body Mass Index (BMI) 40.0 and over, adult: Secondary | ICD-10-CM

## 2019-11-06 MED ORDER — VITAMIN D (ERGOCALCIFEROL) 1.25 MG (50000 UNIT) PO CAPS: 50000.0000 [IU] | ORAL_CAPSULE | ORAL | 0 refills | Status: DC

## 2019-11-06 NOTE — Progress Notes (Signed)
Chief Complaint:   Andrea Garza is here to discuss her progress with her Andrea treatment plan along with follow-up of her Andrea related diagnoses. Andrea Garza is on the Category 4 Plan and states she is following her eating plan approximately 50% of the time. Andrea Garza states she is exercising 0 minutes 0 times per week.  Today's visit was #: 4 Starting weight: 312 lbs Starting date: 09/10/2019 Today's weight: 311 lbs Today's date: 11/06/2019 Total lbs lost to date: 1 Total lbs lost since last in-office visit: 1  Interim History: Andrea Garza is down 1 lb; has not been following her diet. She reports drinking adequate water. Appetite is normal.  Subjective:   Vitamin D deficiency. No nausea, vomiting, or muscle weakness.    Ref. Range 09/10/2019 13:05  Vitamin D, 25-Hydroxy Latest Ref Range: 30.0 - 100.0 ng/mL 22.8 (L)   Essential hypertension. Andrea Garza is taking Cozaar and Norvasc. Blood pressure is controlled.  BP Readings from Last 3 Encounters:  11/06/19 (!) 131/70  10/16/19 132/73  09/24/19 114/76   Lab Results  Component Value Date   CREATININE 0.51 01/12/2011   CREATININE 0.57 01/11/2011   CREATININE 0.62 01/10/2011   At risk for activity intolerance. Andrea Garza is at risk of exercise intolerance secondary to Andrea and deconditioning.  Assessment/Plan:   Vitamin D deficiency. Low Vitamin D level contributes to fatigue and are associated with Andrea, breast, and colon cancer. She was given a prescription for Vitamin D, Ergocalciferol, (DRISDOL) 1.25 MG (50000 UNIT) CAPS capsule every week #4 with 0 refills and will follow-up for routine testing of Vitamin D, at least 2-3 times per year to avoid over-replacement.   Essential hypertension. Andrea Garza is working on healthy weight loss and exercise to improve blood pressure control. We will watch for signs of hypotension as she continues her lifestyle modifications. She will continue her medications as directed.   At risk for  activity intolerance. Andrea Garza was given approximately 15 minutes of exercise intolerance counseling today. She is 59 y.o. female and has risk factors exercise intolerance including Andrea. We discussed intensive lifestyle modifications today with an emphasis on specific weight loss instructions and strategies. Andrea Garza will slowly increase activity as tolerated.  Repetitive spaced learning was employed today to elicit superior memory formation and behavioral change.  Class 3 severe Andrea with serious comorbidity and body mass index (BMI) of 50.0 to 59.9 in adult, unspecified Andrea type (HCC).  Andrea Garza is currently in the action stage of change. As such, her goal is to continue with weight loss efforts. She has agreed to the Category 4 Plan.   She will work on meal planning and intentional eating. She will not bring things into the house that tempt her.  Exercise goals: Andrea Garza will do some resistance training and weights.  Behavioral modification strategies: increasing lean protein intake, decreasing simple carbohydrates, increasing vegetables, increasing water intake, decreasing eating out, no skipping meals, meal planning and cooking strategies, keeping healthy foods in the home and planning for success.  Andrea Garza has agreed to follow-up with our clinic in 2-3 weeks. She was informed of the importance of frequent follow-up visits to maximize her success with intensive lifestyle modifications for her multiple health conditions.   Objective:   Blood pressure (!) 131/70, pulse 71, temperature 98 F (36.7 C), temperature source Oral, height 5\' 4"  (1.626 m), weight (!) 311 lb (141.1 kg), SpO2 97 %. Body mass index is 53.38 kg/m.  General: Cooperative, alert, well developed, in no acute distress.  HEENT: Conjunctivae and lids unremarkable. Cardiovascular: Regular rhythm.  Lungs: Normal work of breathing. Neurologic: No focal deficits.   Lab Results  Component Value Date   CREATININE 0.51  01/12/2011   BUN 6 01/12/2011   NA 136 01/12/2011   K 3.4 (L) 01/12/2011   CL 102 01/12/2011   CO2 30 01/12/2011   Lab Results  Component Value Date   ALT 11 01/10/2011   AST 12 01/10/2011   ALKPHOS 71 01/10/2011   BILITOT 0.4 01/10/2011   No results found for: HGBA1C No results found for: INSULIN Lab Results  Component Value Date   TSH 1.309 01/09/2011   No results found for: CHOL, HDL, LDLCALC, LDLDIRECT, TRIG, CHOLHDL Lab Results  Component Value Date   WBC 9.4 01/12/2011   HGB 8.8 (L) 01/12/2011   HCT 26.0 (L) 01/12/2011   MCV 93.2 01/12/2011   PLT 178 01/12/2011   No results found for: IRON, TIBC, FERRITIN  Attestation Statements:   Reviewed by clinician on day of visit: allergies, medications, problem list, medical history, surgical history, family history, social history, and previous encounter notes.  Fernanda Drum, am acting as Energy manager for [employer], DO   I have reviewed the above documentation for accuracy and completeness, and I agree with the above. Corinna Capra, DO

## 2019-11-27 ENCOUNTER — Ambulatory Visit (INDEPENDENT_AMBULATORY_CARE_PROVIDER_SITE_OTHER): Payer: 59 | Admitting: Bariatrics

## 2019-12-02 ENCOUNTER — Other Ambulatory Visit (INDEPENDENT_AMBULATORY_CARE_PROVIDER_SITE_OTHER): Payer: Self-pay | Admitting: Bariatrics

## 2019-12-02 DIAGNOSIS — E559 Vitamin D deficiency, unspecified: Secondary | ICD-10-CM

## 2019-12-03 ENCOUNTER — Encounter (INDEPENDENT_AMBULATORY_CARE_PROVIDER_SITE_OTHER): Payer: Self-pay | Admitting: Bariatrics

## 2019-12-04 ENCOUNTER — Other Ambulatory Visit (INDEPENDENT_AMBULATORY_CARE_PROVIDER_SITE_OTHER): Payer: Self-pay | Admitting: Bariatrics

## 2019-12-04 DIAGNOSIS — E559 Vitamin D deficiency, unspecified: Secondary | ICD-10-CM

## 2019-12-04 MED ORDER — VITAMIN D (ERGOCALCIFEROL) 1.25 MG (50000 UNIT) PO CAPS: 50000.0000 [IU] | ORAL_CAPSULE | ORAL | 0 refills | Status: AC

## 2019-12-04 NOTE — Telephone Encounter (Signed)
Please review

## 2021-08-23 ENCOUNTER — Other Ambulatory Visit: Payer: Self-pay | Admitting: Obstetrics and Gynecology

## 2021-08-23 DIAGNOSIS — R928 Other abnormal and inconclusive findings on diagnostic imaging of breast: Secondary | ICD-10-CM

## 2021-09-02 ENCOUNTER — Ambulatory Visit: Payer: Commercial Managed Care - HMO

## 2021-09-02 ENCOUNTER — Ambulatory Visit
Admission: RE | Admit: 2021-09-02 | Discharge: 2021-09-02 | Disposition: A | Discharge status: Home / Self Care | Payer: Commercial Managed Care - HMO | Source: Ambulatory Visit | Attending: Obstetrics and Gynecology | Admitting: Obstetrics and Gynecology

## 2021-09-02 DIAGNOSIS — R928 Other abnormal and inconclusive findings on diagnostic imaging of breast: Secondary | ICD-10-CM

## 2021-09-07 ENCOUNTER — Other Ambulatory Visit: Payer: 59

## 2021-11-16 ENCOUNTER — Encounter (INDEPENDENT_AMBULATORY_CARE_PROVIDER_SITE_OTHER): Payer: Self-pay

## 2022-08-16 DIAGNOSIS — K219 Gastro-esophageal reflux disease without esophagitis: Secondary | ICD-10-CM | POA: Diagnosis not present

## 2022-08-16 DIAGNOSIS — E119 Type 2 diabetes mellitus without complications: Secondary | ICD-10-CM | POA: Diagnosis not present

## 2022-08-16 DIAGNOSIS — F419 Anxiety disorder, unspecified: Secondary | ICD-10-CM | POA: Diagnosis not present

## 2022-08-16 DIAGNOSIS — G4700 Insomnia, unspecified: Secondary | ICD-10-CM | POA: Diagnosis not present

## 2022-08-16 DIAGNOSIS — F324 Major depressive disorder, single episode, in partial remission: Secondary | ICD-10-CM | POA: Diagnosis not present

## 2022-08-16 DIAGNOSIS — I10 Essential (primary) hypertension: Secondary | ICD-10-CM | POA: Diagnosis not present

## 2022-08-16 DIAGNOSIS — E782 Mixed hyperlipidemia: Secondary | ICD-10-CM | POA: Diagnosis not present

## 2022-11-20 DIAGNOSIS — H6991 Unspecified Eustachian tube disorder, right ear: Secondary | ICD-10-CM | POA: Diagnosis not present

## 2023-01-09 IMAGING — MG MM DIGITAL DIAGNOSTIC UNILAT*L* W/ TOMO W/ CAD
4 series · 4 of 12 positions shown · non-contrast
Comparison: Previous exam(s).

CLINICAL DATA: 60-year-old female recalled from screening mammogram
dated 08/22/2021 for a possible left breast asymmetry.

EXAM:
DIGITAL DIAGNOSTIC UNILATERAL LEFT MAMMOGRAM WITH TOMOSYNTHESIS AND
CAD
TECHNIQUE: Left digital diagnostic mammography and breast tomosynthesis was
performed. The images were evaluated with computer-aided detection.

[L MLO synth-2D]
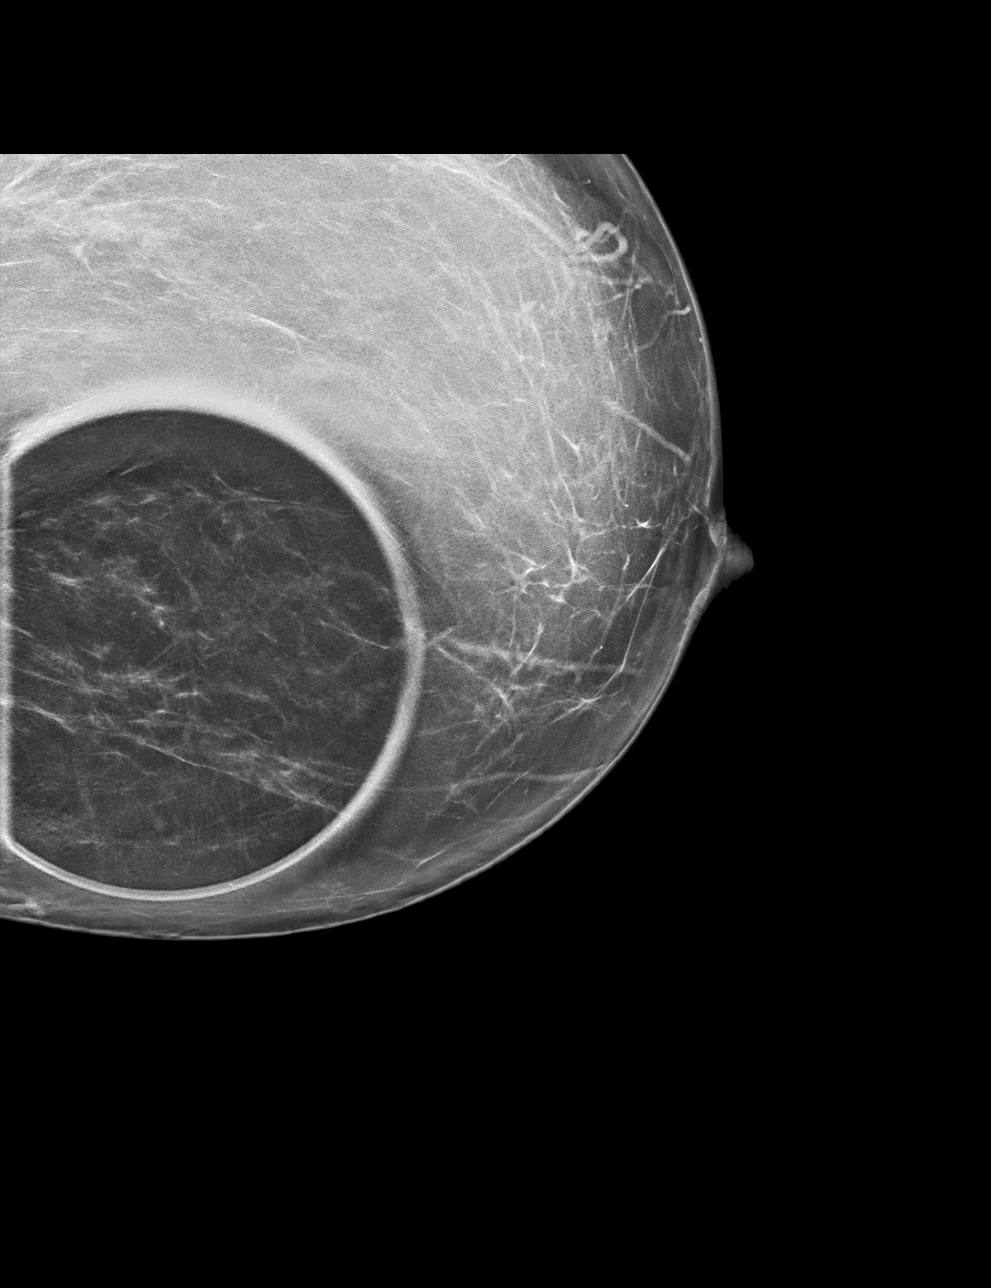

[L ML synth-2D]
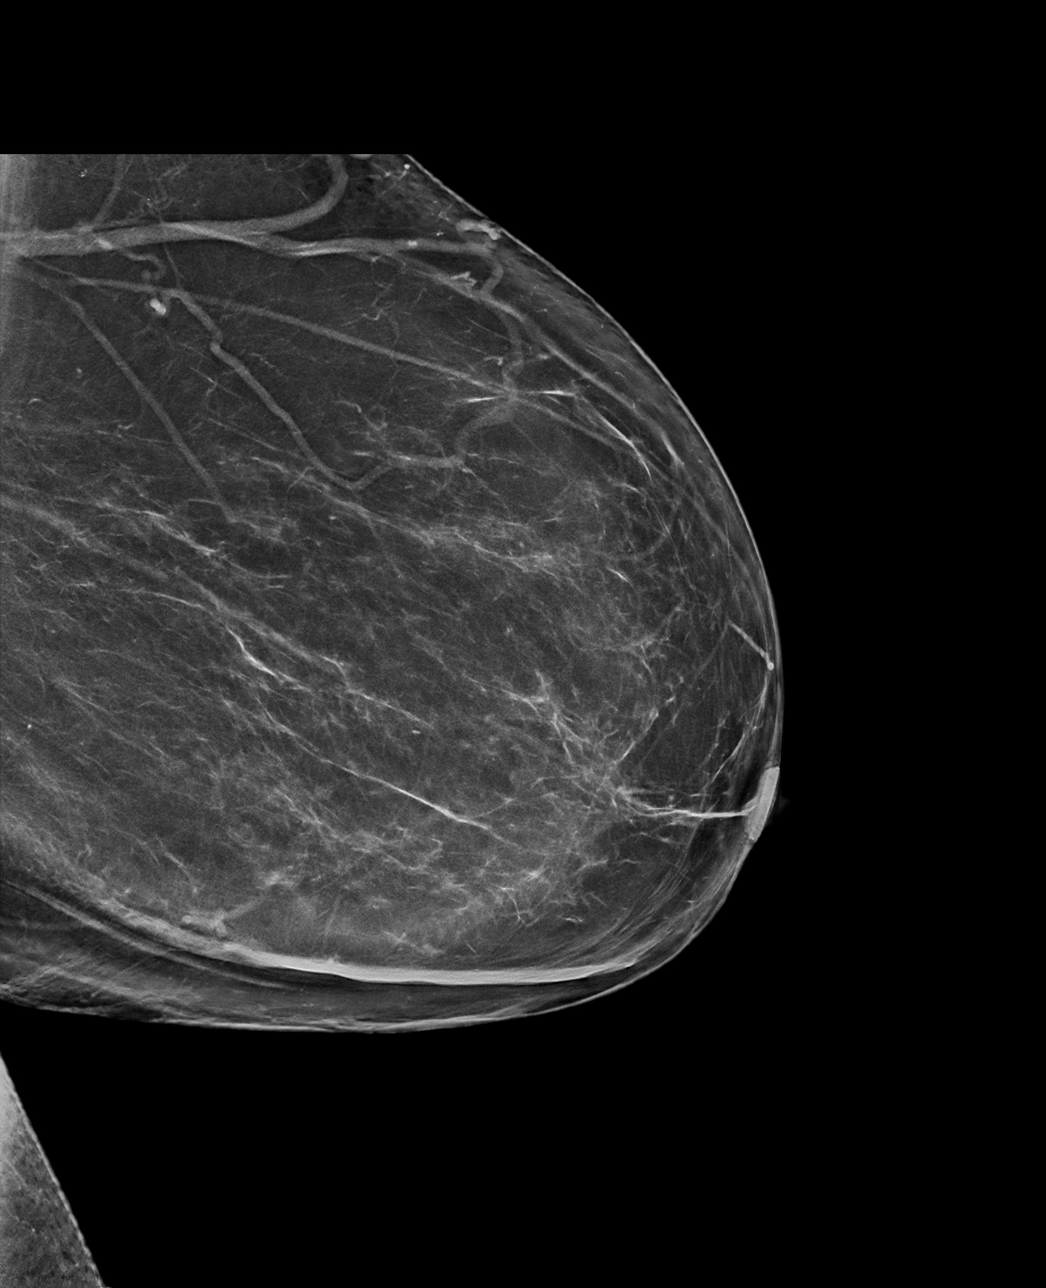

[L ML tomo · tomo slice 41/80.0]
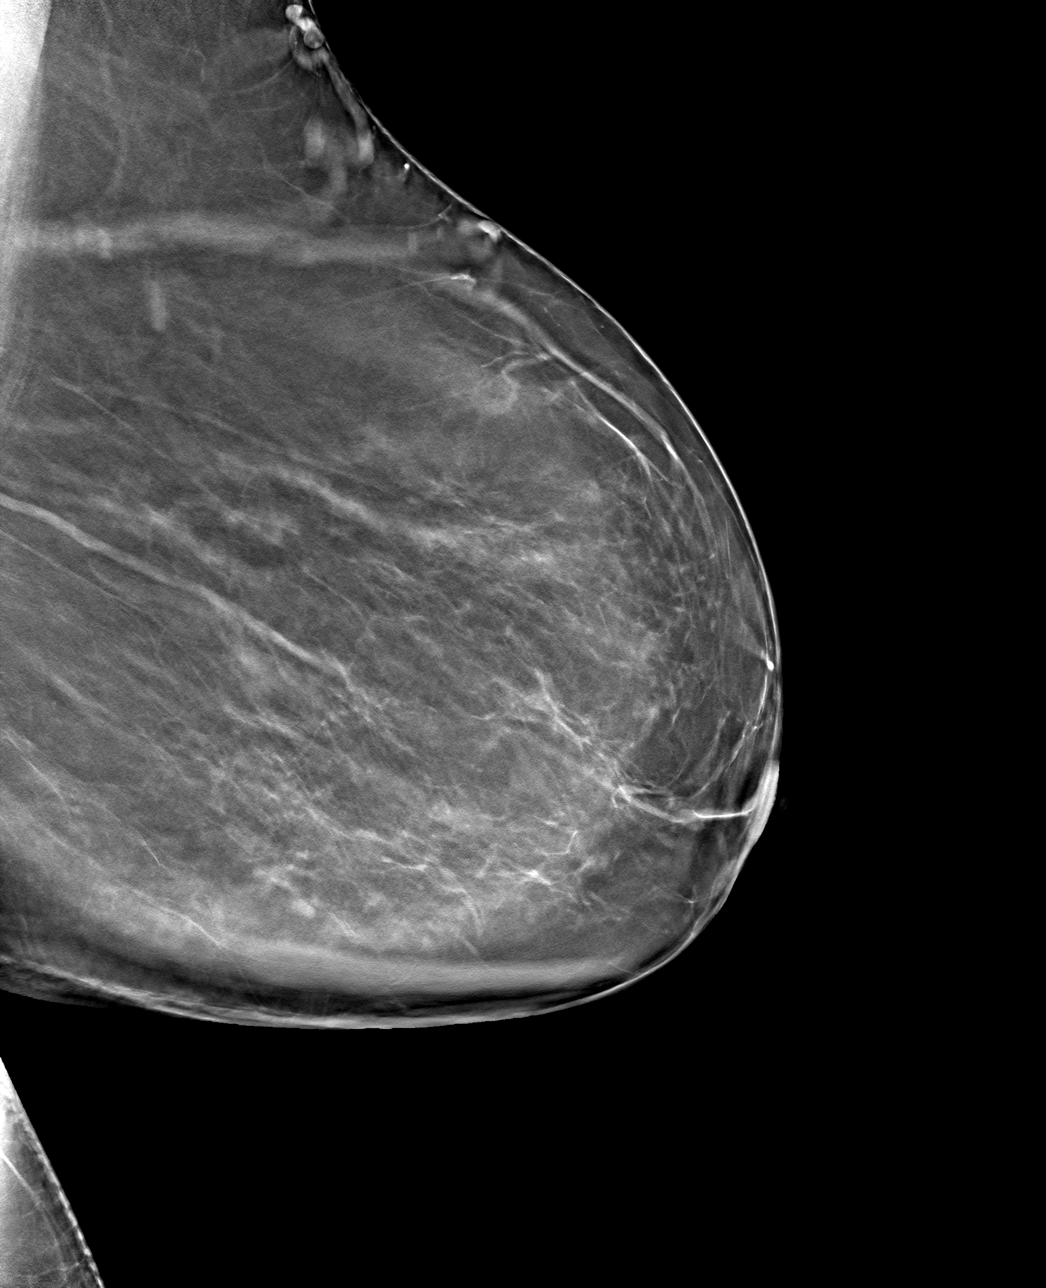

[L MLO tomo · tomo slice 33/66.0]
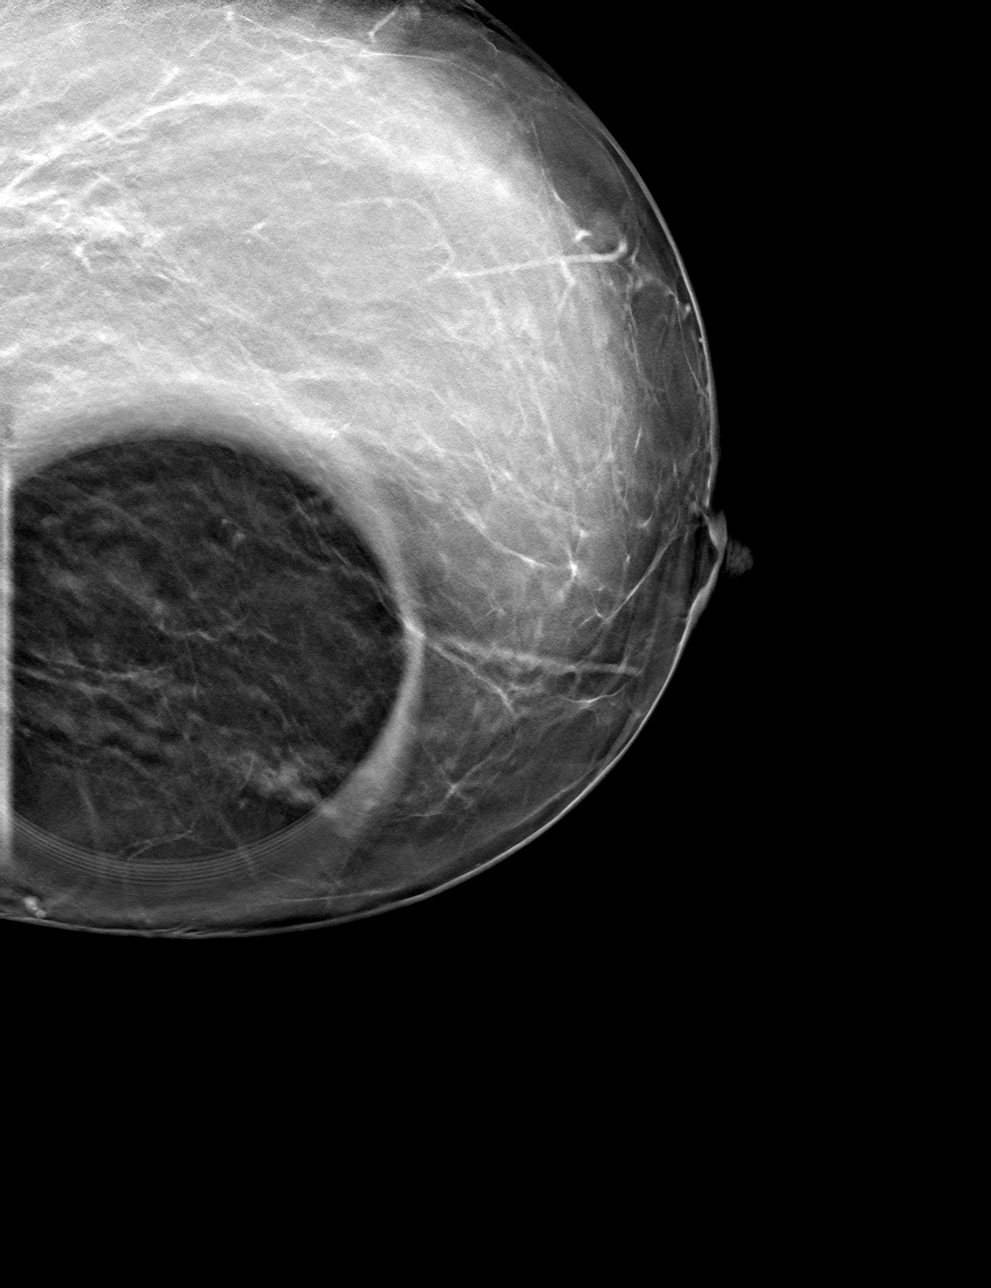

[4 of 12 positions shown; findings below may reference images not displayed]

ACR Breast Density Category b: There are scattered areas of
fibroglandular density.
FINDINGS: Previously described, possible asymmetry in the inferior left breast
at mid depth on the MLO projection only resolves into well dispersed
fibroglandular tissue on additional views. No suspicious findings
identified.
IMPRESSION: No mammographic evidence of malignancy.

RECOMMENDATION:
Screening mammogram in one year.(Code:[id number])

I have discussed the findings and recommendations with the patient.
If applicable, a reminder letter will be sent to the patient
regarding the next appointment.

BI-RADS CATEGORY  1: Negative.

## 2023-03-06 DIAGNOSIS — K219 Gastro-esophageal reflux disease without esophagitis: Secondary | ICD-10-CM | POA: Diagnosis not present

## 2023-03-06 DIAGNOSIS — F324 Major depressive disorder, single episode, in partial remission: Secondary | ICD-10-CM | POA: Diagnosis not present

## 2023-03-06 DIAGNOSIS — E119 Type 2 diabetes mellitus without complications: Secondary | ICD-10-CM | POA: Diagnosis not present

## 2023-03-06 DIAGNOSIS — I10 Essential (primary) hypertension: Secondary | ICD-10-CM | POA: Diagnosis not present

## 2023-03-06 DIAGNOSIS — G4700 Insomnia, unspecified: Secondary | ICD-10-CM | POA: Diagnosis not present

## 2023-03-06 DIAGNOSIS — F419 Anxiety disorder, unspecified: Secondary | ICD-10-CM | POA: Diagnosis not present

## 2023-03-06 DIAGNOSIS — E782 Mixed hyperlipidemia: Secondary | ICD-10-CM | POA: Diagnosis not present

## 2023-09-11 DIAGNOSIS — J0100 Acute maxillary sinusitis, unspecified: Secondary | ICD-10-CM | POA: Diagnosis not present

## 2023-09-11 DIAGNOSIS — G4700 Insomnia, unspecified: Secondary | ICD-10-CM | POA: Diagnosis not present

## 2023-09-11 DIAGNOSIS — E119 Type 2 diabetes mellitus without complications: Secondary | ICD-10-CM | POA: Diagnosis not present

## 2023-09-11 DIAGNOSIS — E782 Mixed hyperlipidemia: Secondary | ICD-10-CM | POA: Diagnosis not present

## 2023-09-11 DIAGNOSIS — I10 Essential (primary) hypertension: Secondary | ICD-10-CM | POA: Diagnosis not present

## 2023-09-11 DIAGNOSIS — F324 Major depressive disorder, single episode, in partial remission: Secondary | ICD-10-CM | POA: Diagnosis not present

## 2023-09-11 DIAGNOSIS — F419 Anxiety disorder, unspecified: Secondary | ICD-10-CM | POA: Diagnosis not present

## 2023-10-26 ENCOUNTER — Encounter: Payer: Self-pay | Admitting: Advanced Practice Midwife

## 2024-01-08 DIAGNOSIS — D485 Neoplasm of uncertain behavior of skin: Secondary | ICD-10-CM | POA: Diagnosis not present

## 2024-01-08 DIAGNOSIS — D045 Carcinoma in situ of skin of trunk: Secondary | ICD-10-CM | POA: Diagnosis not present

## 2024-01-08 DIAGNOSIS — L820 Inflamed seborrheic keratosis: Secondary | ICD-10-CM | POA: Diagnosis not present

## 2024-01-08 DIAGNOSIS — L821 Other seborrheic keratosis: Secondary | ICD-10-CM | POA: Diagnosis not present

## 2024-02-13 DIAGNOSIS — D045 Carcinoma in situ of skin of trunk: Secondary | ICD-10-CM | POA: Diagnosis not present

## 2024-03-18 DIAGNOSIS — G4700 Insomnia, unspecified: Secondary | ICD-10-CM | POA: Diagnosis not present

## 2024-03-18 DIAGNOSIS — E782 Mixed hyperlipidemia: Secondary | ICD-10-CM | POA: Diagnosis not present

## 2024-03-18 DIAGNOSIS — K219 Gastro-esophageal reflux disease without esophagitis: Secondary | ICD-10-CM | POA: Diagnosis not present

## 2024-03-18 DIAGNOSIS — I10 Essential (primary) hypertension: Secondary | ICD-10-CM | POA: Diagnosis not present

## 2024-03-18 DIAGNOSIS — F324 Major depressive disorder, single episode, in partial remission: Secondary | ICD-10-CM | POA: Diagnosis not present

## 2024-03-18 DIAGNOSIS — F419 Anxiety disorder, unspecified: Secondary | ICD-10-CM | POA: Diagnosis not present

## 2024-03-18 DIAGNOSIS — Z0000 Encounter for general adult medical examination without abnormal findings: Secondary | ICD-10-CM | POA: Diagnosis not present

## 2024-03-18 DIAGNOSIS — E119 Type 2 diabetes mellitus without complications: Secondary | ICD-10-CM | POA: Diagnosis not present
